# Patient Record
Sex: Male | Born: 1935 | Race: White | Hispanic: No | Marital: Single | Smoking: Former smoker
Health system: Southern US, Community
[De-identification: ages and names within clinical notes are randomized; demographics above are authoritative.]

## PROBLEM LIST (undated history)

## (undated) DIAGNOSIS — Z7901 Long term (current) use of anticoagulants: Secondary | ICD-10-CM

## (undated) DIAGNOSIS — E785 Hyperlipidemia, unspecified: Secondary | ICD-10-CM

## (undated) DIAGNOSIS — E611 Iron deficiency: Secondary | ICD-10-CM

## (undated) DIAGNOSIS — I480 Paroxysmal atrial fibrillation: Secondary | ICD-10-CM

## (undated) DIAGNOSIS — D649 Anemia, unspecified: Secondary | ICD-10-CM

## (undated) DIAGNOSIS — M1990 Unspecified osteoarthritis, unspecified site: Secondary | ICD-10-CM

## (undated) DIAGNOSIS — N189 Chronic kidney disease, unspecified: Secondary | ICD-10-CM

## (undated) DIAGNOSIS — K635 Polyp of colon: Secondary | ICD-10-CM

## (undated) DIAGNOSIS — I119 Hypertensive heart disease without heart failure: Secondary | ICD-10-CM

## (undated) DIAGNOSIS — C439 Malignant melanoma of skin, unspecified: Secondary | ICD-10-CM

## (undated) DIAGNOSIS — I2510 Atherosclerotic heart disease of native coronary artery without angina pectoris: Secondary | ICD-10-CM

## (undated) DIAGNOSIS — R7303 Prediabetes: Secondary | ICD-10-CM

## (undated) HISTORY — DX: Hypertensive heart disease without heart failure: I11.9

## (undated) HISTORY — DX: Malignant melanoma of skin, unspecified: C43.9

## (undated) HISTORY — DX: Hyperlipidemia, unspecified: E78.5

## (undated) HISTORY — DX: Iron deficiency: E61.1

## (undated) HISTORY — DX: Prediabetes: R73.03

## (undated) HISTORY — DX: Atherosclerotic heart disease of native coronary artery without angina pectoris: I25.10

## (undated) HISTORY — DX: Polyp of colon: K63.5

## (undated) HISTORY — PX: DENTAL SURGERY: SHX609

## (undated) HISTORY — DX: Anemia, unspecified: D64.9

## (undated) HISTORY — DX: Long term (current) use of anticoagulants: Z79.01

## (undated) HISTORY — DX: Paroxysmal atrial fibrillation: I48.0

## (undated) HISTORY — PX: OTHER SURGICAL HISTORY: SHX169

## (undated) HISTORY — PX: MELANOMA EXCISION: SHX5266

## (undated) HISTORY — DX: Chronic kidney disease, unspecified: N18.9

## (undated) HISTORY — DX: Unspecified osteoarthritis, unspecified site: M19.90

---

## 2004-09-30 ENCOUNTER — Ambulatory Visit: Admission: RE | Admit: 2004-09-30 | Discharge: 2004-09-30 | Payer: Self-pay | Admitting: Dermatology

## 2008-03-24 ENCOUNTER — Emergency Department (HOSPITAL_COMMUNITY): Admission: EM | Admit: 2008-03-24 | Discharge: 2008-03-24 | Payer: Self-pay | Admitting: Emergency Medicine

## 2008-03-28 ENCOUNTER — Inpatient Hospital Stay (HOSPITAL_COMMUNITY): Admission: EM | Admit: 2008-03-28 | Discharge: 2008-04-01 | Payer: Self-pay | Admitting: Emergency Medicine

## 2010-06-08 LAB — POCT I-STAT, CHEM 8
Calcium, Ion: 1.08 mmol/L — ABNORMAL LOW (ref 1.12–1.32)
Chloride: 107 mEq/L (ref 96–112)
Creatinine, Ser: 1.9 mg/dL — ABNORMAL HIGH (ref 0.4–1.5)
Glucose, Bld: 142 mg/dL — ABNORMAL HIGH (ref 70–99)
HCT: 41 % (ref 39.0–52.0)

## 2010-06-08 LAB — POCT CARDIAC MARKERS
CKMB, poc: 2.6 ng/mL (ref 1.0–8.0)
Myoglobin, poc: 198 ng/mL (ref 12–200)

## 2010-06-08 LAB — BASIC METABOLIC PANEL
BUN: 26 mg/dL — ABNORMAL HIGH (ref 6–23)
BUN: 30 mg/dL — ABNORMAL HIGH (ref 6–23)
CO2: 30 mEq/L (ref 19–32)
Calcium: 8.8 mg/dL (ref 8.4–10.5)
Chloride: 102 mEq/L (ref 96–112)
Creatinine, Ser: 1.4 mg/dL (ref 0.4–1.5)
Creatinine, Ser: 1.46 mg/dL (ref 0.4–1.5)
GFR calc Af Amer: 59 mL/min — ABNORMAL LOW (ref >60)
GFR calc non Af Amer: 46 mL/min — ABNORMAL LOW (ref >60)
Glucose, Bld: 132 mg/dL — ABNORMAL HIGH (ref 70–99)
Glucose, Bld: 136 mg/dL — ABNORMAL HIGH (ref 70–99)
Potassium: 3.6 mEq/L (ref 3.5–5.1)

## 2010-06-08 LAB — URINALYSIS, ROUTINE W REFLEX MICROSCOPIC
Bilirubin Urine: NEGATIVE
Hgb urine dipstick: NEGATIVE
Ketones, ur: NEGATIVE mg/dL
Nitrite: NEGATIVE
Specific Gravity, Urine: 1.023 (ref 1.005–1.030)
pH: 5 (ref 5.0–8.0)

## 2010-06-08 LAB — PROTIME-INR
INR: 2.2 — ABNORMAL HIGH (ref 0.00–1.49)
INR: 2.2 — ABNORMAL HIGH (ref 0.00–1.49)
INR: 3 — ABNORMAL HIGH (ref 0.00–1.49)
Prothrombin Time: 23.6 seconds — ABNORMAL HIGH (ref 11.6–15.2)
Prothrombin Time: 26 seconds — ABNORMAL HIGH (ref 11.6–15.2)
Prothrombin Time: 33.8 seconds — ABNORMAL HIGH (ref 11.6–15.2)

## 2010-06-08 LAB — CBC
MCHC: 34.6 g/dL (ref 30.0–36.0)
MCV: 95.7 fL (ref 78.0–100.0)
RBC: 3.83 MIL/uL — ABNORMAL LOW (ref 4.22–5.81)
RDW: 13.7 % (ref 11.5–15.5)

## 2010-06-08 LAB — URINE CULTURE
Colony Count: NO GROWTH
Culture: NO GROWTH

## 2010-06-08 LAB — URINE MICROSCOPIC-ADD ON

## 2010-06-08 LAB — BRAIN NATRIURETIC PEPTIDE: Pro B Natriuretic peptide (BNP): 799 pg/mL — ABNORMAL HIGH (ref 0.0–100.0)

## 2010-07-06 NOTE — H&P (Signed)
NAME:  HEIDI, VICENCIO NO.:  [id number]   MEDICAL RECORD NO.:  [id number]          PATIENT TYPE:  EMS   LOCATION:  ED                           FACILITY:  [facility]   PHYSICIAN:  Corinna L. Conley Canal, MDDATE OF BIRTH:  04-Apr-1923   DATE OF ADMISSION:  03/28/2008  DATE OF DISCHARGE:                              HISTORY & PHYSICAL   CHIEF COMPLAINT:  Shortness of breath.   HISTORY OF PRESENT ILLNESS:  Mr. Behnen is an 77 year old white male  who presents with shortness of breath.  He began having a nonproductive  cough 2 days ago.  He has had several sick contacts including his wife  and son with respiratory illness.  He initially was having low grade  fevers of about 99 degrees but last night spiked fevers to 102.5.  He  also became very short of breath with exertion.  EMS was called but the  patient refused transportation to the emergency room.  Instead, he  presents this morning.  According to the ED physician, he was in  respiratory distress, had poor air movement and was sitting bolt  upright.  Since then, he has received Solu-Medrol and bronchodilators  and feels much better.  He has rhinorrhea.  He has no myalgias.  He has  no sore throat, headache.  His son was told in Urgent Care, yesterday,  that he might have the flu.  The patient has a remote history of  smoking, but does not carry the diagnosis of COPD.  He has never been on  bronchodilators.  He reports that the nebulizer today helped quite a  bit.   PAST MEDICAL HISTORY:  1. Hypertension.  2. Atrial fibrillation.  3. Osteoarthritis.  4. Resected melanoma of the scalp.  5. Hypertension.   MEDICATIONS:  1. Z-Pak was called in yesterday, and he is taking a dose.  2. Hydrochlorothiazide 12.5 mg a day.  3. Toprol XL 100 mg a day.  4. Coumadin 2.5 mg every Monday, Wednesday, Friday; 5 mg every other      day.  5. Vicodin as needed for left knee pain.  6. Delsym as needed for cough.   SOCIAL HISTORY:   The patient is married.  He lives with his debilitated  wife and son.  He used to smoke a pack to two packs of cigarettes a day  but quit 20 years ago.  He has no history of heavy alcohol use.   FAMILY HISTORY:  His brother died of unknown cancer.  His sister died of  unknown cancer.   REVIEW OF SYSTEMS:  As above, otherwise negative.   PHYSICAL EXAMINATION:  VITAL SIGNS:  His temperature here is 98 degrees,  blood pressure 129/75, pulse 75, respiratory rate 24, oxygen saturation  97% on oxygen.  GENERAL:  The patient is comfortable.  He is lying about 40 degrees  angle, unable to speak in full sentences.  HEENT:  Normocephalic, atraumatic.  Pupils equal, round, reactive to  light.  Slightly dry mucous membranes.  NECK:  Supple.  No lymphadenopathy.  LUNGS:  Without rales, rhonchi or wheeze.  He  has good air movement,  slightly prolonged expiratory phase.  CARDIOVASCULAR:  Sounds fairly  regular with a few premature beats.  No murmurs, gallops or rubs.  ABDOMEN:  Soft, nontender, nondistended.  GU/RECTAL:  Deferred.  EXTREMITIES:  No clubbing, cyanosis or edema.  The left knee has  decreased range of motion due to pain.  There is no effusion or warmth.  No instability of the joint.  NEUROLOGIC:  The patient is alert and oriented.  Cranial nerves and  sensorimotor exam are intact.  PSYCHIATRIC:  Normal affect.   LABORATORY DATA:  CBC was not done but hemoglobin and hematocrit are  normal.  INR is 3.0.  Basic metabolic panel significant for a glucose of  142, BUN of 24, creatinine 1.9.  Point of care enzymes negative.  BNP  799.  EKG shows probable normal sinus rhythm with PVCs, left axis  deviation.  He is not currently on the monitor.  Chest x-ray shows  elevated left hemidiaphragm.  No infiltrate or CHF.   ASSESSMENT/PLAN:  1. Flu-like illness.  Consider influenza versus acute bronchitis with      bronchospasm, currently improved.  The patient will receive      Tamiflu,  continue Z-Pak.  He will get bronchodilators.  No steroids      are needed at this time as his breathing is improved.  I will order      a seasonal flu swab.  2. Renal insufficiency.  Hold hydrochlorothiazide.  3. Hypertension.  Continue metoprolol.  4. Atrial fibrillation.  He will be monitored on telemetry.  Ask the      pharmacy to dose Coumadin.  He is rate controlled.  5. History of iron deficiency anemia per outpatient records.  6. Possible history of coronary artery disease per outpatient records,      but the patient denies and I have no details.  7. Osteoarthritis.  The patient has an appointment with Dr. Onnie Graham      next week.      Corinna L. Conley Canal, MD  Electronically Signed     CLS/MEDQ  D:  03/28/2008  T:  03/28/2008  Job:  [id number]   cc:   Wenda Low, MD  Fax: [phone]

## 2010-07-06 NOTE — Discharge Summary (Signed)
NAMELEANDRA, CHISUM NO.:  [id number]   MEDICAL RECORD NO.:  [id number]          PATIENT TYPE:  INP   LOCATION:  [id number]                         FACILITY:  [facility]   PHYSICIAN:  Wenda Low, MD      DATE OF BIRTH:  01-Nov-1923   DATE OF ADMISSION:  03/28/2008  DATE OF DISCHARGE:  04/01/2008                               DISCHARGE SUMMARY   DISCHARGE DIAGNOSES:  1. Upper respiratory infection, probably bronchitis versus with some      shortness of breath and wheezing.  2. Atrial fibrillation, rapid ventricular response.  3. History of hypertension.  4. History of chronic anticoagulation.  5. History of melanoma of the scalp.  6. History of osteoarthritis.   DISCHARGE MEDICATIONS:  1. Toprol XL 50 mg 2 tablets in the evening.  2. Coumadin 2.5 mg every Monday, Wednesday, Friday and 5 mg other      days.  3. Delsym cough syrup p.r.n.  4. Cardizem 120 mg q.a.m.  5. Xopenex 0.63 mg nebulizer 2-3 times a day as needed.  6. Tessalon cough perles 200 mg t.i.d. p.r.n. for cough.   DISCONTINUED MEDICATION:  Hydrochlorothiazide.   LABORATORY DATA:  White count 10.9, hemoglobin 12.7, creatinine 1.4, BUN  of 30, potassium 3.6.  Cardiac markers negative.  BNP initially was 799.  UA negative.   Vitals at discharge:  Blood pressure 117/64, heart rate 80 normal sinus  rhythm, temperature 98.1, respirations 18.   DIAGNOSTICS:  Chest x-ray negative.   HOSPITAL COURSE:  An 78 year old male admitted with shortness of breath  and coughing.  Was on azithromycin started about the day before prior to  admission.  Presented with possibly fever and congestion.  Chest x-ray  was negative.  Initially started on Tamiflu and also azithromycin.  He  finished a 5 day course of the Tamiflu and azithromycin, afebrile.  White count remained normal.  He had some wheezing and congestion, and  required some nebulizer treatments with improvement.  It is thought to  be either upper respiratory  illness versus flu.  He did finish a course  of the Tamiflu as well as azithromycin.  Cough improved and oxygenation  remained stable.  1. Cardiac history of atrial fibrillation and hypertension on Coumadin      and Toprol.  Went into AFib with rapid ventricular response in the      130-140s during the hospital course and required some IV Cardizem      overnight which he converted back to normal sinus rhythm.  He was      continued on beta blockers, Toprol.  His Cardizem      was switched from IV to p.o. He will continue that along with the      beta-blocker.  Blood pressure remained stable.  His      hydrochlorothiazide was discontinued.   FOLLOW UP:  Followup in 10 days with me at the office.      Wenda Low, MD  Electronically Signed     KH/MEDQ  D:  04/01/2008  T:  04/01/2008  Job:  [id number]

## 2010-07-09 NOTE — Op Note (Signed)
NAME:  James Strong, James Strong NO.:  [id number]   MEDICAL RECORD NO.:  [id number]          PATIENT TYPE:  AMB   LOCATION:  CATH                         FACILITY:  [facility]   PHYSICIAN:  Fransico Him, M.D.     DATE OF BIRTH:  13-Feb-1924   DATE OF PROCEDURE:  09/30/2004  DATE OF DISCHARGE:                                 OPERATIVE REPORT   REFERRING PHYSICIAN:  Dr. Lysle Rubens.   PROCEDURE:  Left heart catheterization, coronary angiography, left  ventriculography.   OPERATOR:  Fransico Him, M.D.   INDICATIONS:  Abnormal Cardiolite, atrial fibrillation, shortness of breath.   COMPLICATIONS:  None.   INTRAVENOUS ACCESS:  Via right femoral artery 6-French sheath.   HISTORY:  This is a very pleasant, 77 year old male with a history of  hypertension, anemia, and melanoma who has new onset atrial fibrillation and  dyspnea on exertion. He was found to have an EF of 50 to 55% by 2-D  echocardiogram. A stress Cardiolite study revealed an inferior wall defect,  question of diaphragmatic attenuation, increased gut uptake versus  underlying coronary disease. EF by Cardiolite was 42%. He now presents for  cardiac catheterization.   The patient was brought to the cardiac catheterization laboratory in the  fasting nonsedated stated. Informed consent was obtained. The patient was  connected to continuous heart rate and pulse oximetry monitoring and  intermittent blood pressure monitoring. The right groin was prepped and  draped in a sterile fashion. One percent Xylocaine was used for local  anesthesia. Using a modified Seldinger technique, a 6-French sheath was  placed in the right femoral artery. Under fluoroscopic guidance, a 6-French  JL4 catheter was placed in the left coronary artery. Multiple cine films  were taken in 30-degree RAO and 40-degree LAO views. This catheter was then  exchanged out over a guide wire for a 6-French JR4 catheter which was placed  under fluoroscopic  guidance in the right coronary artery. Multiple cine  films were taken in 30-degree RAO and 40-degree LAO views. This catheter was  then exchanged out over a guide wire for a 6-French angled pigtailed  catheter which was placed under fluoroscopic guidance in the left  ventricular cavity. Left ventriculography was performed in 30-degree RAO  view using a total of 30 cc of contrast at 15 cc per second. The catheter  was then pulled back across the valve with no significant gradient noted. At  the end of the procedure, all catheters and sheaths were removed. Manual  compression was performed until adequate hemostasis was obtained. The  patient was transferred back to the room in stable condition.   RESULTS:  The left main coronary artery was widely patent and bifurcates in  the left anterior descending artery and left circumflex artery, actually it  trifurcates in the left anterior descending artery, ramus artery, and left  circumflex artery. Left anterior descending artery gives rise to three  diagonal branches. The first diagonal branch is widely patent. Between the  first and second diagonal branches, there is a 30 to 40% narrowing of the  mid LAD. Then, the LAD gives  rise to two diagonal branches, both of which  are widely patent. The ongoing LAD is widely patent throughout its course.   The ramus is widely patent.   The left circumflex is widely patent throughout its course and gives rise to  one obtuse marginal branch which is widely patent.   The right coronary artery shows luminal irregularities in its proximal and  mid portions and the bifurcates distally in the posterior descending artery  and posterior lateral artery. The posterior descending artery is widely  patent. The posterior lateral artery has a 30% mid stenosis.   Left ventriculography shows normal LV systolic function, EF 123456. Aortic  pressure 150/66 mmHg, LV pressure 153/5 mmHg, LVEDP 15 mmHg.   ASSESSMENT:  1.   Nonobstructive coronary disease.  2.  Normal left ventricular function.  3.  Hypertension.  4.  Atrial fibrillation.  5.  Shortness of breath.   PLAN:  Discharge to home after IV fluids and bed rest. Follow up with me in  two weeks for groin check. Restart Coumadin. Follow up with me in three to  four weeks for reassessment of atrial fibrillation. Once INR is therapeutic  for four weeks, the patient will need cardioversion.       TT/MEDQ  D:  09/30/2004  T:  09/30/2004  Job:  [id number]   cc:   Wenda Low, MD  [address] [place]  [place] [address]  Fax: [phone]

## 2011-12-16 ENCOUNTER — Encounter: Payer: Self-pay | Admitting: Cardiology

## 2012-12-11 ENCOUNTER — Encounter: Payer: Self-pay | Admitting: Cardiology

## 2012-12-11 ENCOUNTER — Encounter: Payer: Self-pay | Admitting: *Deleted

## 2012-12-11 DIAGNOSIS — M1990 Unspecified osteoarthritis, unspecified site: Secondary | ICD-10-CM | POA: Insufficient documentation

## 2012-12-11 DIAGNOSIS — N189 Chronic kidney disease, unspecified: Secondary | ICD-10-CM | POA: Insufficient documentation

## 2012-12-11 DIAGNOSIS — E611 Iron deficiency: Secondary | ICD-10-CM | POA: Insufficient documentation

## 2012-12-11 DIAGNOSIS — C439 Malignant melanoma of skin, unspecified: Secondary | ICD-10-CM | POA: Insufficient documentation

## 2012-12-11 DIAGNOSIS — I2510 Atherosclerotic heart disease of native coronary artery without angina pectoris: Secondary | ICD-10-CM | POA: Insufficient documentation

## 2012-12-11 DIAGNOSIS — E785 Hyperlipidemia, unspecified: Secondary | ICD-10-CM | POA: Insufficient documentation

## 2012-12-11 DIAGNOSIS — R7303 Prediabetes: Secondary | ICD-10-CM | POA: Insufficient documentation

## 2012-12-11 DIAGNOSIS — I10 Essential (primary) hypertension: Secondary | ICD-10-CM | POA: Insufficient documentation

## 2012-12-11 DIAGNOSIS — D638 Anemia in other chronic diseases classified elsewhere: Secondary | ICD-10-CM | POA: Insufficient documentation

## 2012-12-11 DIAGNOSIS — K635 Polyp of colon: Secondary | ICD-10-CM | POA: Insufficient documentation

## 2012-12-11 DIAGNOSIS — D649 Anemia, unspecified: Secondary | ICD-10-CM | POA: Insufficient documentation

## 2012-12-12 ENCOUNTER — Encounter: Payer: Self-pay | Admitting: Cardiology

## 2012-12-12 DIAGNOSIS — Z7901 Long term (current) use of anticoagulants: Secondary | ICD-10-CM | POA: Insufficient documentation

## 2012-12-13 ENCOUNTER — Ambulatory Visit (INDEPENDENT_AMBULATORY_CARE_PROVIDER_SITE_OTHER): Payer: Medicare Other | Admitting: Cardiology

## 2012-12-13 ENCOUNTER — Ambulatory Visit (INDEPENDENT_AMBULATORY_CARE_PROVIDER_SITE_OTHER): Payer: Medicare Other | Admitting: Pharmacist

## 2012-12-13 ENCOUNTER — Encounter: Payer: Self-pay | Admitting: Cardiology

## 2012-12-13 VITALS — BP 160/84 | HR 55 | Ht 68.0 in | Wt 162.0 lb

## 2012-12-13 DIAGNOSIS — I4891 Unspecified atrial fibrillation: Secondary | ICD-10-CM

## 2012-12-13 DIAGNOSIS — Z7901 Long term (current) use of anticoagulants: Secondary | ICD-10-CM

## 2012-12-13 DIAGNOSIS — Z0000 Encounter for general adult medical examination without abnormal findings: Secondary | ICD-10-CM

## 2012-12-13 DIAGNOSIS — I4819 Other persistent atrial fibrillation: Secondary | ICD-10-CM | POA: Insufficient documentation

## 2012-12-13 DIAGNOSIS — I119 Hypertensive heart disease without heart failure: Secondary | ICD-10-CM

## 2012-12-13 DIAGNOSIS — I452 Bifascicular block: Secondary | ICD-10-CM | POA: Insufficient documentation

## 2012-12-13 DIAGNOSIS — R9431 Abnormal electrocardiogram [ECG] [EKG]: Secondary | ICD-10-CM

## 2012-12-13 DIAGNOSIS — I480 Paroxysmal atrial fibrillation: Secondary | ICD-10-CM

## 2012-12-13 DIAGNOSIS — E785 Hyperlipidemia, unspecified: Secondary | ICD-10-CM

## 2012-12-13 DIAGNOSIS — I2510 Atherosclerotic heart disease of native coronary artery without angina pectoris: Secondary | ICD-10-CM

## 2012-12-13 LAB — POCT INR: INR: 3.6

## 2012-12-13 NOTE — Progress Notes (Signed)
[address] [place], [place]  [address] Phone: [phone] Fax:  [phone]  Date:  12/13/2012   ID:  James Strong, DOB December 08, 1923, MRN [id number]  PCP:  No primary provider on file.  Cardiologist:  Fransico Him, MD     History of Present Illness: James Strong is a 79 y.o. male with a history of nonobstructive ASCAD, dyslipidemia, PAF, HTN and chronic anticoagulation who presents today for followup.  He is doing well.  He denies any chest pain, SOB, DOE, LE edema, dizziness, palpitations or syncope.  He fishes for exercise.  He is limited in his exercise due to bad knees.   Wt Readings from Last 3 Encounters:  12/13/12 162 lb (73.483 kg)     Past Medical History  Diagnosis Date  . Prediabetes   . Hyperlipidemia   . Benign hypertensive heart disease without heart failure   . Melanoma of skin, site unspecified   . Anemia   . Iron deficiency   . Osteoarthrosis   . CKD (chronic kidney disease)   . Colon polyp   . PAF (paroxysmal atrial fibrillation)   . Chronic anticoagulation   . HTN (hypertension)   . Coronary atherosclerosis of native coronary artery     nonobstructive ASCAD - 30-40% mid LAD and 30% mid PL by cath 2006    Current Outpatient Prescriptions  Medication Sig Dispense Refill  . diltiazem (TIAZAC) 120 MG 24 hr capsule Take 120 mg by mouth daily.      . metoprolol (LOPRESSOR) 50 MG tablet Take 50 mg by mouth 2 (two) times daily.      . simvastatin (ZOCOR) 20 MG tablet Take 1/2 tab daily      . warfarin (COUMADIN) 5 MG tablet Take 5 mg by mouth daily. As directed       No current facility-administered medications for this visit.    Allergies:   No Known Allergies  Social History:  The patient  reports that he quit smoking about 43 years ago. He does not have any smokeless tobacco history on file. He reports that he drinks alcohol.   Family History:  The patient's family history includes Dementia in his father.   ROS:  Please see the  history of present illness.      All other systems reviewed and negative.   PHYSICAL EXAM: VS:  BP 160/84  Pulse 55  Ht 5\' 8"  (1.727 m)  Wt 162 lb (73.483 kg)  BMI 24.64 kg/m2 Well nourished, well developed, in no acute distress HEENT: normal Neck: no JVD Cardiac:  normal S1, S2; RRR; no murmur Lungs:  clear to auscultation bilaterally, no wheezing, rhonchi or rales Abd: soft, nontender, no hepatomegaly Ext: no edema Skin: warm and dry Neuro:  CNs 2-12 intact, no focal abnormalities noted  EKG:  NSR with RBBB and LAFB     ASSESSMENT AND PLAN:  1. PAF maintaining NSR  - continue Diltiazem/metoprolol/warfarin 2.   RBBB/LAFB - this is new for him.  I will get a Lexiscan myoview to rule out ischemia 3.   Dyslipidemia  - continue simvastatin 4.   HTN with mildly elevated BP.    - continue metoprolol/Diltiazem  - counseled in low sodium diet and cut out salt when cooking  - BP check with nurse in our office in 2 weeks 5.   Nonobstructive ASCAD 6.   Chronic anticoagulation  Followup with me in 6 months  Signed, Fransico Him, MD 12/13/2012 11:21 AM

## 2012-12-13 NOTE — Patient Instructions (Signed)
Your physician recommends that you continue on your current medications as directed. Please refer to the Current Medication list given to you today.  Your physician wants you to follow-up in: 6 months with Dr Mallie Snooks will receive a reminder letter in the mail two months in advance. If you don't receive a letter, please call our office to schedule the follow-up appointment.  Set up a Nurse visit in 1-2 weeks for a BP check  Your physician has requested that you have a lexiscan myoview. For further information please visit [email or web address]. Please follow instruction sheet, as given.

## 2012-12-31 ENCOUNTER — Ambulatory Visit (INDEPENDENT_AMBULATORY_CARE_PROVIDER_SITE_OTHER): Payer: Medicare Other | Admitting: *Deleted

## 2012-12-31 ENCOUNTER — Ambulatory Visit (INDEPENDENT_AMBULATORY_CARE_PROVIDER_SITE_OTHER): Payer: Medicare Other | Admitting: Pharmacist

## 2012-12-31 ENCOUNTER — Ambulatory Visit (HOSPITAL_COMMUNITY): Payer: Medicare Other | Attending: Cardiology | Admitting: Radiology

## 2012-12-31 VITALS — BP 120/62 | HR 62 | Wt 156.8 lb

## 2012-12-31 VITALS — BP 150/70 | HR 58 | Ht 69.0 in | Wt 157.0 lb

## 2012-12-31 DIAGNOSIS — E785 Hyperlipidemia, unspecified: Secondary | ICD-10-CM | POA: Insufficient documentation

## 2012-12-31 DIAGNOSIS — I4510 Unspecified right bundle-branch block: Secondary | ICD-10-CM | POA: Insufficient documentation

## 2012-12-31 DIAGNOSIS — R9431 Abnormal electrocardiogram [ECG] [EKG]: Secondary | ICD-10-CM | POA: Insufficient documentation

## 2012-12-31 DIAGNOSIS — I10 Essential (primary) hypertension: Secondary | ICD-10-CM | POA: Insufficient documentation

## 2012-12-31 DIAGNOSIS — I4891 Unspecified atrial fibrillation: Secondary | ICD-10-CM

## 2012-12-31 DIAGNOSIS — Z87891 Personal history of nicotine dependence: Secondary | ICD-10-CM | POA: Insufficient documentation

## 2012-12-31 DIAGNOSIS — I2510 Atherosclerotic heart disease of native coronary artery without angina pectoris: Secondary | ICD-10-CM

## 2012-12-31 MED ORDER — REGADENOSON 0.4 MG/5ML IV SOLN
0.4000 mg | Freq: Once | INTRAVENOUS | Status: AC
  Administered 2012-12-31: 0.4 mg via INTRAVENOUS

## 2012-12-31 MED ORDER — TECHNETIUM TC 99M SESTAMIBI GENERIC - CARDIOLITE
10.0000 | Freq: Once | INTRAVENOUS | Status: AC | PRN
  Administered 2012-12-31: 10 via INTRAVENOUS

## 2012-12-31 MED ORDER — TECHNETIUM TC 99M SESTAMIBI GENERIC - CARDIOLITE
30.0000 | Freq: Once | INTRAVENOUS | Status: AC | PRN
  Administered 2012-12-31: 30 via INTRAVENOUS

## 2012-12-31 NOTE — Progress Notes (Signed)
-

## 2012-12-31 NOTE — Progress Notes (Signed)
Patient here for BP check today. Follow up BP from 10/23 office visit with Dr. Radford Pax. Patient is without complaints. He has been on a salt free diet since his last office visit. He has taken his medications this morning. He is pending a myoview today. BP well controlled. Will forward to Dr. Radford Pax for review.

## 2012-12-31 NOTE — Progress Notes (Signed)
  [facility] [address] [place],  [address] [phone]    Cardiology Nuclear Med Study  [name] is a 79 y.o. male     MRN : [id number]     DOB: 29-Jun-1923  Procedure Date: 12/31/2012  Nuclear Med Background Indication for Stress Test:  Evaluation for Ischemia and Abnormal EKG History:  n/o CAD; h/o PAF; Echo; '06 [id number] wall ischemia, EF=42% Cardiac Risk Factors: History of Smoking, Hypertension, Lipids and RBBB  Symptoms:  No cardiac complaints today.   Nuclear Pre-Procedure Caffeine/Decaff Intake:  7:00pm NPO After: 7:00pm   Lungs:  Clear. O2 Sat: 96% on room air. IV 0.9% NS with Angio Cath:  22g  IV Site: R Hand  IV Started by:  Matilde Haymaker, RN  Chest Size (in):  42 Cup Size: n/a  Height: 5\' 9"  (1.753 m)  Weight:  157 lb (71.215 kg)  BMI:  Body mass index is 23.17 kg/(m^2). Tech Comments: Lopressor taken at [facility] Study 1 or 2 day study: 1 day  Stress Test Type:  Lexiscan  Reading MD: Fransico Him, MD  Order Authorizing Provider:  Vonna Drafts  Resting Radionuclide: Technetium 58m Sestamibi  Resting Radionuclide Dose: 11.0 mCi   Stress Radionuclide:  Technetium 73m Sestamibi  Stress Radionuclide Dose: 33.0 mCi           Stress Protocol Rest HR: 58 Stress HR: 75  Rest BP: 150/70 Stress BP: 151/73  Exercise Time (min): n/a METS: n/a   Predicted Max HR: 133 bpm % Max HR: 56.39 bpm Rate Pressure Product: 11325   Dose of Adenosine (mg):  n/a Dose of Lexiscan: 0.4 mg  Dose of Atropine (mg): n/a Dose of Dobutamine: n/a mcg/kg/min (at max HR)  Stress Test Technologist: Letta Moynahan, CMA-N  Nuclear Technologist:  Annye Rusk, CNMT     Rest Procedure:  Myocardial perfusion imaging was performed at rest 45 minutes following the intravenous administration of Technetium 73m Sestamibi.  Rest ECG: NSR with IRBBB  Stress Procedure:  The patient received IV Lexiscan 0.4 mg over 15-seconds.   Technetium 68m Sestamibi injected at 30-seconds.  Occasional PVC's were noted with Lexiscan.  Quantitative spect images were obtained after a 45 minute delay.  Stress ECG: No significant change from baseline ECG  QPS Raw Data Images:  Mild diaphragmatic attenuation.  Normal left ventricular size. Stress Images:  There is decreased uptake in the inferior wall. Rest Images:  There is decreased uptake in the inferior wall. Subtraction (SDS):  There is a fixed inferior defect that is most consistent with diaphragmatic attenuation. Transient Ischemic Dilatation (Normal <1.22):  0.95 Lung/Heart Ratio (Normal <0.45):  0.41  Quantitative Gated Spect Images QGS EDV:  100 ml QGS ESV:  51 ml  Impression Exercise Capacity:  Lexiscan with no exercise. BP Response:  Normal blood pressure response. Clinical Symptoms:  No significant symptoms noted. ECG Impression:  No significant ST segment change suggestive of ischemia. Comparison with Prior Nuclear Study: No images to compare  Overall Impression:  Low risk stress nuclear study fixed defect in the inferoapical segments consistent with diaphragmatic attenuation and gut uptake.  LV Ejection Fraction: 49%.  LV Wall Motion:  NL LV Function; NL Wall Motion   Signed: Fransico Him, MD 12/31/2012

## 2013-01-04 ENCOUNTER — Telehealth: Payer: Self-pay | Admitting: Cardiology

## 2013-01-04 NOTE — Telephone Encounter (Signed)
Had already made Pt aware that stress test results came back normal. I told [name] she could call me back to discuss if she wanted to.

## 2013-01-04 NOTE — Telephone Encounter (Signed)
Follow Up  Daughter calling back for stress results

## 2013-01-28 ENCOUNTER — Ambulatory Visit (INDEPENDENT_AMBULATORY_CARE_PROVIDER_SITE_OTHER): Payer: Medicare Other | Admitting: Pharmacist

## 2013-01-28 DIAGNOSIS — I4891 Unspecified atrial fibrillation: Secondary | ICD-10-CM

## 2013-01-28 LAB — POCT INR: INR: 2.1

## 2013-03-11 ENCOUNTER — Ambulatory Visit (INDEPENDENT_AMBULATORY_CARE_PROVIDER_SITE_OTHER): Payer: Medicare Other | Admitting: Pharmacist

## 2013-03-11 DIAGNOSIS — I4891 Unspecified atrial fibrillation: Secondary | ICD-10-CM

## 2013-03-11 LAB — POCT INR: INR: 2.6

## 2013-04-22 ENCOUNTER — Ambulatory Visit (INDEPENDENT_AMBULATORY_CARE_PROVIDER_SITE_OTHER): Payer: Medicare Other | Admitting: Pharmacist

## 2013-04-22 DIAGNOSIS — I4891 Unspecified atrial fibrillation: Secondary | ICD-10-CM

## 2013-04-22 LAB — POCT INR: INR: 2.6

## 2013-06-03 ENCOUNTER — Ambulatory Visit (INDEPENDENT_AMBULATORY_CARE_PROVIDER_SITE_OTHER): Payer: Medicare Other | Admitting: Pharmacist

## 2013-06-03 DIAGNOSIS — I4891 Unspecified atrial fibrillation: Secondary | ICD-10-CM

## 2013-06-03 LAB — POCT INR: INR: 2

## 2013-06-17 NOTE — Telephone Encounter (Signed)
Please close encounter, Thanks! SR

## 2013-07-18 ENCOUNTER — Ambulatory Visit (INDEPENDENT_AMBULATORY_CARE_PROVIDER_SITE_OTHER): Payer: Medicare Other

## 2013-07-18 DIAGNOSIS — I4891 Unspecified atrial fibrillation: Secondary | ICD-10-CM

## 2013-07-18 LAB — POCT INR: INR: 1.9

## 2013-08-29 ENCOUNTER — Ambulatory Visit (INDEPENDENT_AMBULATORY_CARE_PROVIDER_SITE_OTHER): Payer: Medicare Other

## 2013-08-29 DIAGNOSIS — I4891 Unspecified atrial fibrillation: Secondary | ICD-10-CM

## 2013-08-29 LAB — POCT INR: INR: 1.8

## 2013-09-26 ENCOUNTER — Ambulatory Visit (INDEPENDENT_AMBULATORY_CARE_PROVIDER_SITE_OTHER): Payer: Medicare Other | Admitting: *Deleted

## 2013-09-26 DIAGNOSIS — I4891 Unspecified atrial fibrillation: Secondary | ICD-10-CM

## 2013-09-26 LAB — POCT INR: INR: 2

## 2013-10-24 ENCOUNTER — Ambulatory Visit (INDEPENDENT_AMBULATORY_CARE_PROVIDER_SITE_OTHER): Payer: Medicare Other

## 2013-10-24 DIAGNOSIS — I4891 Unspecified atrial fibrillation: Secondary | ICD-10-CM

## 2013-10-24 LAB — POCT INR: INR: 2.2

## 2013-11-25 NOTE — Progress Notes (Signed)
  [address] [place], [place]  [address] Phone: [phone] Fax:  [phone]  Date:  11/26/2013   ID:  James Strong, DOB 1923-07-10, MRN [id number]  PCP:  Wenda Low, MD  Cardiologist:  Fransico Him, MD    History of Present Illness: James Strong is a 80 y.o. male with a history of nonobstructive ASCAD, dyslipidemia, PAF, HTN and chronic anticoagulation who presents today for followup. He is doing well. He denies any chest pain, SOB, DOE, LE edema, dizziness, palpitations or syncope. He fishes for exercise. He is limited in his exercise due to bad knees.     Wt Readings from Last 3 Encounters:  11/26/13 154 lb (69.854 kg)  12/31/12 157 lb (71.215 kg)  12/31/12 156 lb 12 oz (71.101 kg)     Past Medical History  Diagnosis Date  . Prediabetes   . Hyperlipidemia   . Benign hypertensive heart disease without heart failure   . Melanoma of skin, site unspecified   . Anemia   . Iron deficiency   . Osteoarthrosis   . CKD (chronic kidney disease)   . Colon polyp   . PAF (paroxysmal atrial fibrillation)   . Chronic anticoagulation   . HTN (hypertension)   . Coronary atherosclerosis of native coronary artery     nonobstructive ASCAD - 30-40% mid LAD and 30% mid PL by cath 2006    Current Outpatient Prescriptions  Medication Sig Dispense Refill  . diltiazem (TIAZAC) 120 MG 24 hr capsule Take 120 mg by mouth daily.      . metoprolol (LOPRESSOR) 50 MG tablet Take 50 mg by mouth 2 (two) times daily.      . simvastatin (ZOCOR) 20 MG tablet Take 1/2 tab daily      . warfarin (COUMADIN) 5 MG tablet Take 5 mg by mouth daily. As directed       No current facility-administered medications for this visit.    Allergies:   No Known Allergies  Social History:  The patient  reports that he quit smoking about 43 years ago. He does not have any smokeless tobacco history on file. He reports that he drinks alcohol.   Family History:  The patient's family history  includes Dementia in his father.   ROS:  Please see the history of present illness.      All other systems reviewed and negative.   PHYSICAL EXAM: VS:  BP 140/62  Pulse 59  Ht 5\' 9"  (1.753 m)  Wt 154 lb (69.854 kg)  BMI 22.73 kg/m2 Well nourished, well developed, in no acute distress HEENT: normal Neck: no JVD Cardiac:  normal S1, S2; RRR; no murmur Lungs:  clear to auscultation bilaterally, no wheezing, rhonchi or rales Abd: soft, nontender, no hepatomegaly Ext: trace edema Skin: warm and dry Neuro:  CNs 2-12 intact, no focal abnormalities noted   ASSESSMENT AND PLAN:  1.  PAF maintaining NSR - continue Diltiazem/metoprolol/warfarin  2. RBBB/LAFB  3. Dyslipidemia - LDL at goal at 62 on lipids 11/15/2013 - continue simvastatin  4. HTN  - well controlled - continue metoprolol/Diltiazem   5. Nonobstructive ASCAD - lexiscan myoview 11/14 with no ischemia - no angina 6. Chronic anticoagulation   Followup with me in 6 months   Signed, Fransico Him, MD [facility] 11/26/2013 8:32 AM

## 2013-11-26 ENCOUNTER — Encounter: Payer: Self-pay | Admitting: Cardiology

## 2013-11-26 ENCOUNTER — Ambulatory Visit (INDEPENDENT_AMBULATORY_CARE_PROVIDER_SITE_OTHER): Payer: Medicare Other | Admitting: Pharmacist

## 2013-11-26 ENCOUNTER — Ambulatory Visit (INDEPENDENT_AMBULATORY_CARE_PROVIDER_SITE_OTHER): Payer: Medicare Other | Admitting: Cardiology

## 2013-11-26 VITALS — BP 140/62 | HR 59 | Ht 69.0 in | Wt 154.0 lb

## 2013-11-26 DIAGNOSIS — I480 Paroxysmal atrial fibrillation: Secondary | ICD-10-CM

## 2013-11-26 DIAGNOSIS — I10 Essential (primary) hypertension: Secondary | ICD-10-CM

## 2013-11-26 DIAGNOSIS — Z7901 Long term (current) use of anticoagulants: Secondary | ICD-10-CM

## 2013-11-26 DIAGNOSIS — E785 Hyperlipidemia, unspecified: Secondary | ICD-10-CM

## 2013-11-26 DIAGNOSIS — I4891 Unspecified atrial fibrillation: Secondary | ICD-10-CM

## 2013-11-26 DIAGNOSIS — I2510 Atherosclerotic heart disease of native coronary artery without angina pectoris: Secondary | ICD-10-CM

## 2013-11-26 DIAGNOSIS — I452 Bifascicular block: Secondary | ICD-10-CM

## 2013-11-26 LAB — POCT INR: INR: 2

## 2013-11-26 NOTE — Patient Instructions (Signed)
Your physician recommends that you continue on your current medications as directed. Please refer to the Current Medication list given to you today.  Your physician wants you to follow-up in: 6 months. You will receive a reminder letter in the mail two months in advance. If you don't receive a letter, please call our office to schedule the follow-up appointment.  

## 2013-11-29 ENCOUNTER — Encounter: Payer: Self-pay | Admitting: Cardiology

## 2014-01-07 ENCOUNTER — Ambulatory Visit (INDEPENDENT_AMBULATORY_CARE_PROVIDER_SITE_OTHER): Payer: Medicare Other | Admitting: Pharmacist Clinician (PhC)/ Clinical Pharmacy Specialist

## 2014-01-07 DIAGNOSIS — I480 Paroxysmal atrial fibrillation: Secondary | ICD-10-CM

## 2014-01-07 DIAGNOSIS — I4891 Unspecified atrial fibrillation: Secondary | ICD-10-CM

## 2014-01-07 LAB — POCT INR: INR: 2.4

## 2014-02-10 ENCOUNTER — Ambulatory Visit (INDEPENDENT_AMBULATORY_CARE_PROVIDER_SITE_OTHER): Payer: Medicare Other | Admitting: Surgery

## 2014-02-10 DIAGNOSIS — I4891 Unspecified atrial fibrillation: Secondary | ICD-10-CM

## 2014-02-10 DIAGNOSIS — I480 Paroxysmal atrial fibrillation: Secondary | ICD-10-CM

## 2014-02-10 LAB — POCT INR: INR: 2.8

## 2014-03-12 ENCOUNTER — Encounter: Payer: Self-pay | Admitting: Cardiology

## 2014-03-24 ENCOUNTER — Ambulatory Visit (INDEPENDENT_AMBULATORY_CARE_PROVIDER_SITE_OTHER): Payer: Medicare Other | Admitting: Pharmacist

## 2014-03-24 DIAGNOSIS — I480 Paroxysmal atrial fibrillation: Secondary | ICD-10-CM

## 2014-03-24 DIAGNOSIS — I4891 Unspecified atrial fibrillation: Secondary | ICD-10-CM

## 2014-03-24 LAB — POCT INR: INR: 2.4

## 2014-04-28 ENCOUNTER — Ambulatory Visit (INDEPENDENT_AMBULATORY_CARE_PROVIDER_SITE_OTHER): Payer: Medicare Other

## 2014-04-28 DIAGNOSIS — I4891 Unspecified atrial fibrillation: Secondary | ICD-10-CM

## 2014-04-28 DIAGNOSIS — I480 Paroxysmal atrial fibrillation: Secondary | ICD-10-CM

## 2014-04-28 LAB — POCT INR: INR: 2.2

## 2014-05-27 NOTE — Progress Notes (Signed)
Cardiology Office Note   Date:  05/28/2014   ID:  NHUT NAQVI, DOB 79-Mar-1925, MRN [id number]  PCP:  Wenda Low, MD    Chief Complaint  Patient presents with  . Atrial Fibrillation  . Hypertension  . Hyperlipidemia      History of Present Illness: James Strong is a 81 y.o. male with a history of nonobstructive ASCAD, dyslipidemia, PAF, HTN and chronic anticoagulation who presents today for followup. He is doing well. He denies any chest pain or pressure, SOB, DOE, LE edema, dizziness, palpitations or syncope. He fishes for exercise. He is limited in his exercise due to bad knees.      Past Medical History  Diagnosis Date  . Prediabetes   . Hyperlipidemia   . Benign hypertensive heart disease without heart failure   . Melanoma of skin, site unspecified   . Anemia   . Iron deficiency   . Osteoarthrosis   . CKD (chronic kidney disease)   . Colon polyp   . PAF (paroxysmal atrial fibrillation)   . Chronic anticoagulation   . HTN (hypertension)   . Coronary atherosclerosis of native coronary artery     nonobstructive ASCAD - 30-40% mid LAD and 30% mid PL by cath 2006    Past Surgical History  Procedure Laterality Date  . Skin melonoma removed from scalp    . Melanoma excision      superficial on scalp     Current Outpatient Prescriptions  Medication Sig Dispense Refill  . diltiazem (TIAZAC) 120 MG 24 hr capsule Take 120 mg by mouth daily.    . metoprolol (LOPRESSOR) 50 MG tablet Take 50 mg by mouth 2 (two) times daily.    . simvastatin (ZOCOR) 20 MG tablet Take 1/2 tab daily    . warfarin (COUMADIN) 5 MG tablet Take 5 mg by mouth daily. As directed     No current facility-administered medications for this visit.    Allergies:   Review of patient's allergies indicates no known allergies.    Social History:  The patient  reports that he quit smoking about 44 years ago. He does not have any smokeless tobacco history on file. He reports that he drinks  alcohol.   Family History:  The patient's family history includes Dementia in his father.    ROS:  Please see the history of present illness.   Otherwise, review of systems are positive for none.   All other systems are reviewed and negative.    PHYSICAL EXAM: VS:  BP 160/78 mmHg  Pulse 55  Ht 5\' 9"  (1.753 m)  Wt 152 lb 12.8 oz (69.31 kg)  BMI 22.55 kg/m2 , BMI Body mass index is 22.55 kg/(m^2). GEN: Well nourished, well developed, in no acute distress HEENT: normal Neck: no JVD, carotid bruits, or masses Cardiac: RRR; no murmurs, rubs, or gallops.  Trace edema  Respiratory:  clear to auscultation bilaterally, normal work of breathing GI: soft, nontender, nondistended, + BS MS: no deformity or atrophy Skin: warm and dry, no rash Neuro:  Strength and sensation are intact Psych: euthymic mood, full affect   EKG:  EKG was ordered today and sinus bradycardia with RBBB and LAFB and first degree AV block    Recent Labs: No results found for requested labs within last 365 days.    Lipid Panel No results found for: CHOL, TRIG, HDL, CHOLHDL, VLDL, LDLCALC, LDLDIRECT    Wt Readings from Last 3 Encounters:  05/28/14 152 lb 12.8 oz (  69.31 kg)  11/26/13 154 lb (69.854 kg)  12/31/12 157 lb (71.215 kg)    ASSESSMENT AND PLAN:  1. PAF maintaining NSR - continue Diltiazem/metoprolol/warfarin  2. RBBB/LAFB  3. Dyslipidemia - LDL at goal at 62 on lipids 11/15/2013 - continue simvastatin  - check FLP and ALT 4. HTN - mildly elevated today but normal at Dr. Glenna Durand office on Monday.   - continue metoprolol/Diltiazem  5. Nonobstructive ASCAD - lexiscan myoview 11/14 with no ischemia - no angina.  No ASA due to warfarin 6. Chronic anticoagulation    Current medicines are reviewed at length with the patient today.  The patient does not have concerns regarding medicines.  The following changes have been made:  no change  Labs/ tests ordered today include: see above assessment  and plan  Orders Placed This Encounter  Procedures  . Hepatic function panel  . Lipid Profile  . EKG 12-Lead     Disposition:   FU with me in 6 months   Signed, Sueanne Margarita, MD  05/28/2014 12:14 PM    [facility] [place], [place], [place]  [address] Phone: [phone]; Fax: [phone]

## 2014-05-28 ENCOUNTER — Ambulatory Visit (INDEPENDENT_AMBULATORY_CARE_PROVIDER_SITE_OTHER): Payer: Medicare Other | Admitting: *Deleted

## 2014-05-28 ENCOUNTER — Encounter: Payer: Self-pay | Admitting: Cardiology

## 2014-05-28 ENCOUNTER — Ambulatory Visit (INDEPENDENT_AMBULATORY_CARE_PROVIDER_SITE_OTHER): Payer: Medicare Other | Admitting: Cardiology

## 2014-05-28 VITALS — BP 160/78 | HR 55 | Ht 69.0 in | Wt 152.8 lb

## 2014-05-28 DIAGNOSIS — I452 Bifascicular block: Secondary | ICD-10-CM

## 2014-05-28 DIAGNOSIS — I4891 Unspecified atrial fibrillation: Secondary | ICD-10-CM | POA: Diagnosis not present

## 2014-05-28 DIAGNOSIS — Z515 Encounter for palliative care: Secondary | ICD-10-CM | POA: Insufficient documentation

## 2014-05-28 DIAGNOSIS — I2510 Atherosclerotic heart disease of native coronary artery without angina pectoris: Secondary | ICD-10-CM

## 2014-05-28 DIAGNOSIS — I10 Essential (primary) hypertension: Secondary | ICD-10-CM

## 2014-05-28 DIAGNOSIS — I480 Paroxysmal atrial fibrillation: Secondary | ICD-10-CM | POA: Diagnosis not present

## 2014-05-28 DIAGNOSIS — E785 Hyperlipidemia, unspecified: Secondary | ICD-10-CM

## 2014-05-28 DIAGNOSIS — Z5181 Encounter for therapeutic drug level monitoring: Secondary | ICD-10-CM

## 2014-05-28 LAB — POCT INR: INR: 3

## 2014-05-28 NOTE — Patient Instructions (Signed)
Your physician recommends that you continue on your current medications as directed. Please refer to the Current Medication list given to you today.  Your physician recommends that you return for lab work in: 1 week (LFT, Lipids)  Your physician wants you to follow-up in: 6 months with Dr. Radford Pax. You will receive a reminder letter in the mail two months in advance. If you don't receive a letter, please call our office to schedule the follow-up appointment.

## 2014-06-03 ENCOUNTER — Other Ambulatory Visit (INDEPENDENT_AMBULATORY_CARE_PROVIDER_SITE_OTHER): Payer: Medicare Other

## 2014-06-03 DIAGNOSIS — I10 Essential (primary) hypertension: Secondary | ICD-10-CM

## 2014-06-03 DIAGNOSIS — E785 Hyperlipidemia, unspecified: Secondary | ICD-10-CM

## 2014-06-03 LAB — LIPID PANEL
CHOLESTEROL: 104 mg/dL (ref 0–200)
HDL: 28.7 mg/dL — AB (ref >39.00)
LDL Cholesterol: 56 mg/dL (ref 0–99)
NonHDL: 75.3
Total CHOL/HDL Ratio: 4
Triglycerides: 95 mg/dL (ref 0.0–149.0)
VLDL: 19 mg/dL (ref 0.0–40.0)

## 2014-06-03 LAB — HEPATIC FUNCTION PANEL
ALBUMIN: 3.5 g/dL (ref 3.5–5.2)
ALT: 17 U/L (ref 0–53)
AST: 24 U/L (ref 0–37)
Alkaline Phosphatase: 86 U/L (ref 39–117)
Bilirubin, Direct: 0.2 mg/dL (ref 0.0–0.3)
TOTAL PROTEIN: 7.2 g/dL (ref 6.0–8.3)
Total Bilirubin: 0.8 mg/dL (ref 0.2–1.2)

## 2014-07-09 ENCOUNTER — Ambulatory Visit (INDEPENDENT_AMBULATORY_CARE_PROVIDER_SITE_OTHER): Payer: Medicare Other | Admitting: *Deleted

## 2014-07-09 DIAGNOSIS — I4891 Unspecified atrial fibrillation: Secondary | ICD-10-CM | POA: Diagnosis not present

## 2014-07-09 DIAGNOSIS — Z5181 Encounter for therapeutic drug level monitoring: Secondary | ICD-10-CM

## 2014-07-09 DIAGNOSIS — I480 Paroxysmal atrial fibrillation: Secondary | ICD-10-CM

## 2014-07-09 LAB — POCT INR: INR: 2.4

## 2014-08-20 ENCOUNTER — Ambulatory Visit (INDEPENDENT_AMBULATORY_CARE_PROVIDER_SITE_OTHER): Payer: Medicare Other | Admitting: *Deleted

## 2014-08-20 DIAGNOSIS — I4891 Unspecified atrial fibrillation: Secondary | ICD-10-CM | POA: Diagnosis not present

## 2014-08-20 DIAGNOSIS — Z5181 Encounter for therapeutic drug level monitoring: Secondary | ICD-10-CM | POA: Diagnosis not present

## 2014-08-20 DIAGNOSIS — I480 Paroxysmal atrial fibrillation: Secondary | ICD-10-CM | POA: Diagnosis not present

## 2014-08-20 LAB — POCT INR: INR: 2.3

## 2014-09-24 ENCOUNTER — Encounter: Payer: Self-pay | Admitting: Cardiology

## 2014-10-01 ENCOUNTER — Ambulatory Visit (INDEPENDENT_AMBULATORY_CARE_PROVIDER_SITE_OTHER): Payer: Medicare Other | Admitting: Pharmacist

## 2014-10-01 DIAGNOSIS — Z5181 Encounter for therapeutic drug level monitoring: Secondary | ICD-10-CM

## 2014-10-01 DIAGNOSIS — I4891 Unspecified atrial fibrillation: Secondary | ICD-10-CM

## 2014-10-01 DIAGNOSIS — I480 Paroxysmal atrial fibrillation: Secondary | ICD-10-CM

## 2014-10-01 LAB — POCT INR: INR: 2.3

## 2014-11-12 ENCOUNTER — Ambulatory Visit (INDEPENDENT_AMBULATORY_CARE_PROVIDER_SITE_OTHER): Payer: Medicare Other | Admitting: Pharmacist

## 2014-11-12 DIAGNOSIS — I480 Paroxysmal atrial fibrillation: Secondary | ICD-10-CM | POA: Diagnosis not present

## 2014-11-12 DIAGNOSIS — Z5181 Encounter for therapeutic drug level monitoring: Secondary | ICD-10-CM

## 2014-11-12 DIAGNOSIS — I4891 Unspecified atrial fibrillation: Secondary | ICD-10-CM

## 2014-11-12 LAB — POCT INR: INR: 2.2

## 2014-12-10 ENCOUNTER — Ambulatory Visit (INDEPENDENT_AMBULATORY_CARE_PROVIDER_SITE_OTHER): Payer: Medicare Other | Admitting: Cardiology

## 2014-12-10 ENCOUNTER — Encounter: Payer: Self-pay | Admitting: Cardiology

## 2014-12-10 VITALS — BP 132/50 | HR 70 | Ht 69.0 in | Wt 151.0 lb

## 2014-12-10 DIAGNOSIS — E785 Hyperlipidemia, unspecified: Secondary | ICD-10-CM

## 2014-12-10 DIAGNOSIS — I2510 Atherosclerotic heart disease of native coronary artery without angina pectoris: Secondary | ICD-10-CM | POA: Diagnosis not present

## 2014-12-10 DIAGNOSIS — I480 Paroxysmal atrial fibrillation: Secondary | ICD-10-CM

## 2014-12-10 DIAGNOSIS — I10 Essential (primary) hypertension: Secondary | ICD-10-CM

## 2014-12-10 NOTE — Patient Instructions (Signed)

## 2014-12-10 NOTE — Progress Notes (Addendum)
Cardiology Office Note   Date:  12/10/2014   ID:  OUSMANE Strong, DOB 09-Jun-1923, MRN [id number]  PCP:  James Low, MD    Chief Complaint  Patient presents with  . Coronary Artery Disease  . Atrial Fibrillation  . Hypertension      History of Present Illness: James Strong is a 81 y.o. male with a history of nonobstructive ASCAD, dyslipidemia, PAF, HTN and chronic anticoagulation who presents today for followup. He is doing well. He denies any chest pain or pressure, SOB, DOE, LE edema, dizziness, palpitations or syncope. He fishes for exercise. He is limited in his exercise due to bad knees.      Past Medical History  Diagnosis Date  . Prediabetes   . Hyperlipidemia   . Benign hypertensive heart disease without heart failure   . Melanoma of skin, site unspecified   . Anemia   . Iron deficiency   . Osteoarthrosis   . CKD (chronic kidney disease)   . Colon polyp   . PAF (paroxysmal atrial fibrillation) ([place])   . Chronic anticoagulation   . HTN (hypertension)   . Coronary atherosclerosis of native coronary artery     nonobstructive ASCAD - 30-40% mid LAD and 30% mid PL by cath 2006    Past Surgical History  Procedure Laterality Date  . Skin melonoma removed from scalp    . Melanoma excision      superficial on scalp     Current Outpatient Prescriptions  Medication Sig Dispense Refill  . diltiazem (TIAZAC) 120 MG 24 hr capsule Take 120 mg by mouth daily.    . metoprolol (LOPRESSOR) 50 MG tablet Take 50 mg by mouth 2 (two) times daily.    . simvastatin (ZOCOR) 20 MG tablet Take 1/2 tab daily    . warfarin (COUMADIN) 5 MG tablet Take 5 mg by mouth daily. As directed     No current facility-administered medications for this visit.    Allergies:   Review of patient's allergies indicates no known allergies.    Social History:  The patient  reports that he quit smoking about 45 years ago. He does not have any smokeless tobacco history  on file. He reports that he drinks alcohol.   Family History:  The patient's family history includes Dementia in his father.    ROS:  Please see the history of present illness.   Otherwise, review of systems are positive for none.   All other systems are reviewed and negative.    PHYSICAL EXAM: VS:  BP 132/50 mmHg  Pulse 70  Ht 5\' 9"  (1.753 m)  Wt 151 lb (68.493 kg)  BMI 22.29 kg/m2 , BMI Body mass index is 22.29 kg/(m^2). GEN: Well nourished, well developed, in no acute distress HEENT: normal Neck: no JVD, carotid bruits, or masses Cardiac: RRR; no murmurs, rubs, or gallops,no edema  Respiratory:  clear to auscultation bilaterally, normal work of breathing GI: soft, nontender, nondistended, + BS MS: no deformity or atrophy Skin: warm and dry, no rash Neuro:  Strength and sensation are intact Psych: euthymic mood, full affect   EKG:  EKG is not ordered today.    Recent Labs: 06/03/2014: ALT 17    Lipid Panel    Component Value Date/Time   CHOL 104 06/03/2014 0847   TRIG 95.0 06/03/2014 0847   HDL 28.70* 06/03/2014 0847   CHOLHDL 4 06/03/2014 0847  VLDL 19.0 06/03/2014 0847   LDLCALC 56 06/03/2014 0847      Wt Readings from Last 3 Encounters:  12/10/14 151 lb (68.493 kg)  05/28/14 152 lb 12.8 oz (69.31 kg)  11/26/13 154 lb (69.854 kg)    ASSESSMENT AND PLAN:  1. PAF maintaining NSR - continue Diltiazem/metoprolol/warfarin  2. RBBB/LAFB  3. Dyslipidemia - LDL at goal at 62 on lipids 11/15/2013 - continue simvastatin  4. HTN - BP controlled - continue metoprolol/Diltiazem  5. Nonobstructive ASCAD - lexiscan myoview 11/14 with no ischemia - no angina. No ASA due to warfarin 6. Chronic anticoagulation      Current medicines are reviewed at length with the patient today.  The patient does not have concerns regarding medicines.  The following changes have been made:  no change  Labs/ tests ordered today: See above Assessment and Plan No orders of the  defined types were placed in this encounter.     Disposition:   FU with me in 6 months  Signed, Sueanne Margarita, MD  12/10/2014 2:49 PM    [facility] [place], [place], [place]  [address] Phone: [phone]; Fax: [phone]

## 2014-12-24 ENCOUNTER — Ambulatory Visit (INDEPENDENT_AMBULATORY_CARE_PROVIDER_SITE_OTHER): Payer: Medicare Other | Admitting: *Deleted

## 2014-12-24 DIAGNOSIS — I480 Paroxysmal atrial fibrillation: Secondary | ICD-10-CM

## 2014-12-24 DIAGNOSIS — I4891 Unspecified atrial fibrillation: Secondary | ICD-10-CM | POA: Diagnosis not present

## 2014-12-24 DIAGNOSIS — Z5181 Encounter for therapeutic drug level monitoring: Secondary | ICD-10-CM | POA: Diagnosis not present

## 2014-12-24 LAB — POCT INR: INR: 2

## 2015-01-27 ENCOUNTER — Ambulatory Visit (INDEPENDENT_AMBULATORY_CARE_PROVIDER_SITE_OTHER): Payer: Medicare Other | Admitting: *Deleted

## 2015-01-27 DIAGNOSIS — Z5181 Encounter for therapeutic drug level monitoring: Secondary | ICD-10-CM

## 2015-01-27 DIAGNOSIS — I480 Paroxysmal atrial fibrillation: Secondary | ICD-10-CM

## 2015-01-27 DIAGNOSIS — I4891 Unspecified atrial fibrillation: Secondary | ICD-10-CM | POA: Diagnosis not present

## 2015-01-27 LAB — POCT INR: INR: 2

## 2015-03-10 ENCOUNTER — Ambulatory Visit (INDEPENDENT_AMBULATORY_CARE_PROVIDER_SITE_OTHER): Payer: Medicare Other | Admitting: *Deleted

## 2015-03-10 DIAGNOSIS — I480 Paroxysmal atrial fibrillation: Secondary | ICD-10-CM

## 2015-03-10 DIAGNOSIS — I4891 Unspecified atrial fibrillation: Secondary | ICD-10-CM | POA: Diagnosis not present

## 2015-03-10 DIAGNOSIS — Z5181 Encounter for therapeutic drug level monitoring: Secondary | ICD-10-CM

## 2015-03-10 LAB — POCT INR: INR: 2

## 2015-04-21 ENCOUNTER — Ambulatory Visit (INDEPENDENT_AMBULATORY_CARE_PROVIDER_SITE_OTHER): Payer: Medicare Other | Admitting: *Deleted

## 2015-04-21 DIAGNOSIS — Z5181 Encounter for therapeutic drug level monitoring: Secondary | ICD-10-CM

## 2015-04-21 DIAGNOSIS — I4891 Unspecified atrial fibrillation: Secondary | ICD-10-CM

## 2015-04-21 DIAGNOSIS — I480 Paroxysmal atrial fibrillation: Secondary | ICD-10-CM

## 2015-04-21 LAB — POCT INR: INR: 1.9

## 2015-05-19 ENCOUNTER — Ambulatory Visit (INDEPENDENT_AMBULATORY_CARE_PROVIDER_SITE_OTHER): Payer: Medicare Other | Admitting: *Deleted

## 2015-05-19 DIAGNOSIS — Z5181 Encounter for therapeutic drug level monitoring: Secondary | ICD-10-CM | POA: Diagnosis not present

## 2015-05-19 DIAGNOSIS — I480 Paroxysmal atrial fibrillation: Secondary | ICD-10-CM

## 2015-05-19 DIAGNOSIS — I4891 Unspecified atrial fibrillation: Secondary | ICD-10-CM | POA: Diagnosis not present

## 2015-05-19 LAB — POCT INR: INR: 2.6

## 2015-06-10 DIAGNOSIS — N183 Chronic kidney disease, stage 3 (moderate): Secondary | ICD-10-CM | POA: Diagnosis not present

## 2015-06-10 DIAGNOSIS — C439 Malignant melanoma of skin, unspecified: Secondary | ICD-10-CM | POA: Diagnosis not present

## 2015-06-10 DIAGNOSIS — I10 Essential (primary) hypertension: Secondary | ICD-10-CM | POA: Diagnosis not present

## 2015-06-10 DIAGNOSIS — I482 Chronic atrial fibrillation: Secondary | ICD-10-CM | POA: Diagnosis not present

## 2015-06-10 DIAGNOSIS — E7800 Pure hypercholesterolemia, unspecified: Secondary | ICD-10-CM | POA: Diagnosis not present

## 2015-06-10 DIAGNOSIS — E46 Unspecified protein-calorie malnutrition: Secondary | ICD-10-CM | POA: Diagnosis not present

## 2015-06-22 ENCOUNTER — Ambulatory Visit: Payer: Medicare Other | Admitting: Cardiology

## 2015-06-22 ENCOUNTER — Ambulatory Visit (INDEPENDENT_AMBULATORY_CARE_PROVIDER_SITE_OTHER): Payer: Medicare Other | Admitting: Pharmacist

## 2015-06-22 DIAGNOSIS — Z5181 Encounter for therapeutic drug level monitoring: Secondary | ICD-10-CM | POA: Diagnosis not present

## 2015-06-22 DIAGNOSIS — I480 Paroxysmal atrial fibrillation: Secondary | ICD-10-CM

## 2015-06-22 DIAGNOSIS — I4891 Unspecified atrial fibrillation: Secondary | ICD-10-CM | POA: Diagnosis not present

## 2015-06-22 LAB — POCT INR: INR: 2

## 2015-06-24 DIAGNOSIS — C44629 Squamous cell carcinoma of skin of left upper limb, including shoulder: Secondary | ICD-10-CM | POA: Diagnosis not present

## 2015-06-24 DIAGNOSIS — L570 Actinic keratosis: Secondary | ICD-10-CM | POA: Diagnosis not present

## 2015-08-03 ENCOUNTER — Ambulatory Visit (INDEPENDENT_AMBULATORY_CARE_PROVIDER_SITE_OTHER): Payer: Medicare Other | Admitting: *Deleted

## 2015-08-03 DIAGNOSIS — I480 Paroxysmal atrial fibrillation: Secondary | ICD-10-CM | POA: Diagnosis not present

## 2015-08-03 DIAGNOSIS — I4891 Unspecified atrial fibrillation: Secondary | ICD-10-CM | POA: Diagnosis not present

## 2015-08-03 DIAGNOSIS — Z5181 Encounter for therapeutic drug level monitoring: Secondary | ICD-10-CM | POA: Diagnosis not present

## 2015-08-03 LAB — POCT INR: INR: 2.2

## 2015-08-19 DIAGNOSIS — Z85828 Personal history of other malignant neoplasm of skin: Secondary | ICD-10-CM | POA: Diagnosis not present

## 2015-09-15 ENCOUNTER — Ambulatory Visit (INDEPENDENT_AMBULATORY_CARE_PROVIDER_SITE_OTHER): Payer: Medicare Other

## 2015-09-15 DIAGNOSIS — I480 Paroxysmal atrial fibrillation: Secondary | ICD-10-CM | POA: Diagnosis not present

## 2015-09-15 DIAGNOSIS — I4891 Unspecified atrial fibrillation: Secondary | ICD-10-CM | POA: Diagnosis not present

## 2015-09-15 DIAGNOSIS — Z5181 Encounter for therapeutic drug level monitoring: Secondary | ICD-10-CM

## 2015-09-15 LAB — POCT INR: INR: 2.3

## 2015-10-27 ENCOUNTER — Ambulatory Visit (INDEPENDENT_AMBULATORY_CARE_PROVIDER_SITE_OTHER): Payer: Medicare Other | Admitting: *Deleted

## 2015-10-27 DIAGNOSIS — I4891 Unspecified atrial fibrillation: Secondary | ICD-10-CM | POA: Diagnosis not present

## 2015-10-27 DIAGNOSIS — I480 Paroxysmal atrial fibrillation: Secondary | ICD-10-CM

## 2015-10-27 DIAGNOSIS — Z5181 Encounter for therapeutic drug level monitoring: Secondary | ICD-10-CM | POA: Diagnosis not present

## 2015-10-27 LAB — POCT INR: INR: 2.3

## 2015-11-18 DIAGNOSIS — C44311 Basal cell carcinoma of skin of nose: Secondary | ICD-10-CM | POA: Diagnosis not present

## 2015-11-18 DIAGNOSIS — Z85828 Personal history of other malignant neoplasm of skin: Secondary | ICD-10-CM | POA: Diagnosis not present

## 2015-12-11 ENCOUNTER — Encounter: Payer: Self-pay | Admitting: Cardiology

## 2015-12-11 DIAGNOSIS — C439 Malignant melanoma of skin, unspecified: Secondary | ICD-10-CM | POA: Diagnosis not present

## 2015-12-11 DIAGNOSIS — E782 Mixed hyperlipidemia: Secondary | ICD-10-CM | POA: Diagnosis not present

## 2015-12-11 DIAGNOSIS — Z0000 Encounter for general adult medical examination without abnormal findings: Secondary | ICD-10-CM | POA: Diagnosis not present

## 2015-12-11 DIAGNOSIS — N183 Chronic kidney disease, stage 3 (moderate): Secondary | ICD-10-CM | POA: Diagnosis not present

## 2015-12-11 DIAGNOSIS — G4700 Insomnia, unspecified: Secondary | ICD-10-CM | POA: Diagnosis not present

## 2015-12-11 DIAGNOSIS — M1990 Unspecified osteoarthritis, unspecified site: Secondary | ICD-10-CM | POA: Diagnosis not present

## 2015-12-11 DIAGNOSIS — E46 Unspecified protein-calorie malnutrition: Secondary | ICD-10-CM | POA: Diagnosis not present

## 2015-12-11 DIAGNOSIS — I482 Chronic atrial fibrillation: Secondary | ICD-10-CM | POA: Diagnosis not present

## 2015-12-11 DIAGNOSIS — C4491 Basal cell carcinoma of skin, unspecified: Secondary | ICD-10-CM | POA: Diagnosis not present

## 2015-12-11 DIAGNOSIS — I10 Essential (primary) hypertension: Secondary | ICD-10-CM | POA: Diagnosis not present

## 2015-12-18 ENCOUNTER — Ambulatory Visit (INDEPENDENT_AMBULATORY_CARE_PROVIDER_SITE_OTHER): Payer: Medicare Other | Admitting: Cardiology

## 2015-12-18 ENCOUNTER — Ambulatory Visit (INDEPENDENT_AMBULATORY_CARE_PROVIDER_SITE_OTHER): Payer: Medicare Other | Admitting: *Deleted

## 2015-12-18 ENCOUNTER — Encounter (INDEPENDENT_AMBULATORY_CARE_PROVIDER_SITE_OTHER): Payer: Self-pay

## 2015-12-18 VITALS — BP 154/76 | HR 60 | Ht 69.0 in | Wt 143.4 lb

## 2015-12-18 DIAGNOSIS — I10 Essential (primary) hypertension: Secondary | ICD-10-CM

## 2015-12-18 DIAGNOSIS — I4891 Unspecified atrial fibrillation: Secondary | ICD-10-CM | POA: Diagnosis not present

## 2015-12-18 DIAGNOSIS — I2510 Atherosclerotic heart disease of native coronary artery without angina pectoris: Secondary | ICD-10-CM

## 2015-12-18 DIAGNOSIS — I481 Persistent atrial fibrillation: Secondary | ICD-10-CM | POA: Diagnosis not present

## 2015-12-18 DIAGNOSIS — I4819 Other persistent atrial fibrillation: Secondary | ICD-10-CM

## 2015-12-18 DIAGNOSIS — E7800 Pure hypercholesterolemia, unspecified: Secondary | ICD-10-CM | POA: Diagnosis not present

## 2015-12-18 DIAGNOSIS — I480 Paroxysmal atrial fibrillation: Secondary | ICD-10-CM

## 2015-12-18 DIAGNOSIS — Z5181 Encounter for therapeutic drug level monitoring: Secondary | ICD-10-CM

## 2015-12-18 LAB — POCT INR: INR: 3.1

## 2015-12-18 NOTE — Progress Notes (Signed)
Cardiology Office Note    Date:  12/18/2015   ID:  James Strong, DOB 02/28/1923, MRN [id number]  PCP:  Wenda Low, MD  Cardiologist:  Fransico Him, MD   Chief Complaint  Patient presents with  . Coronary Artery Disease  . Hypertension  . Hyperlipidemia    History of Present Illness:  James Strong is a 82 y.o. male with a history of nonobstructive ASCAD, dyslipidemia, PAF, HTN and chronic anticoagulation who presents today for followup. He is doing well. He denies any chest pain or pressure, SOB, DOE, LE edema, dizziness, palpitations or syncope. He fishes for exercise.     Past Medical History:  Diagnosis Date  . Anemia   . Benign hypertensive heart disease without heart failure   . Chronic anticoagulation   . CKD (chronic kidney disease)   . Colon polyp   . Coronary atherosclerosis of native coronary artery    nonobstructive ASCAD - 30-40% mid LAD and 30% mid PL by cath 2006  . HTN (hypertension)   . Hyperlipidemia   . Iron deficiency   . Melanoma of skin, site unspecified ([place])   . Osteoarthrosis   . PAF (paroxysmal atrial fibrillation) ([place] [facility])   . Prediabetes     Past Surgical History:  Procedure Laterality Date  . MELANOMA EXCISION     superficial on scalp  . skin melonoma removed from scalp      Current Medications: Outpatient Medications Prior to Visit  Medication Sig Dispense Refill  . diltiazem (TIAZAC) 120 MG 24 hr capsule Take 120 mg by mouth daily.    . metoprolol (LOPRESSOR) 50 MG tablet Take 50 mg by mouth 2 (two) times daily.    . mirtazapine (REMERON) 15 MG tablet Take 15 mg by mouth at bedtime.    . simvastatin (ZOCOR) 20 MG tablet Take 10 mg by mouth daily. Take 1/2 tablet by mouth daily    . warfarin (COUMADIN) 5 MG tablet Take 5 mg by mouth daily. As directed     No facility-administered medications prior to visit.      Allergies:   Review of patient's allergies indicates no known allergies.   Social History   Social History    . Marital status: Single    Spouse name: N/A  . Number of children: N/A  . Years of education: N/A   Social History Main Topics  . Smoking status: Former Smoker    Quit date: 12/12/1969  . Smokeless tobacco: Not on file  . Alcohol use Yes     Comment: occasionally  . Drug use: Unknown  . Sexual activity: Not on file   Other Topics Concern  . Not on file   Social History Narrative  . No narrative on file     Family History:  The patient's family history includes Dementia in his father.   ROS:   Please see the history of present illness.    ROS All other systems reviewed and are negative.  No flowsheet data found.     PHYSICAL EXAM:   VS:  BP (!) 154/76   Pulse 60   Ht 5\' 9"  (1.753 m)   Wt 143 lb 6.4 oz (65 kg)   BMI 21.18 kg/m    GEN: Well nourished, well developed, in no acute distress  HEENT: normal  Neck: no JVD, carotid bruits, or masses Cardiac: RRR; no murmurs, rubs, or gallops,no edema.  Intact distal pulses bilaterally.  Respiratory:  clear to auscultation bilaterally, normal work of breathing  GI: soft, nontender, nondistended, + BS MS: no deformity or atrophy  Skin: warm and dry, no rash Neuro:  Alert and Oriented x 3, Strength and sensation are intact Psych: euthymic mood, full affect  Wt Readings from Last 3 Encounters:  12/18/15 143 lb 6.4 oz (65 kg)  12/10/14 151 lb (68.5 kg)  05/28/14 152 lb 12.8 oz (69.3 kg)      Studies/Labs Reviewed:   EKG:  EKG is  ordered today and showed sinus bradycardia at 59bpm with RBBB and LAFB  Recent Labs: No results found for requested labs within last 8760 hours.   Lipid Panel    Component Value Date/Time   CHOL 104 06/03/2014 0847   TRIG 95.0 06/03/2014 0847   HDL 28.70 (L) 06/03/2014 0847   CHOLHDL 4 06/03/2014 0847   VLDL 19.0 06/03/2014 0847   LDLCALC 56 06/03/2014 0847    Additional studies/ records that were reviewed today include:  none    ASSESSMENT:    1. Atherosclerosis of native  coronary artery of native heart without angina pectoris   2. Essential hypertension   3. Persistent atrial fibrillation ([place])   4. Pure hypercholesterolemia      PLAN:  In order of problems listed above:  1. Non obstructive ASCAD - he has not had any anginal symptoms.  He is not on ASA due to warfarin. Continue statin and BB. 2. HTN - BP controlled on current meds.  Continue CCB/BB. 3. Persistent atrial fibrillation - maintaining NSR. Continue BB/CCB and warfarin. 4. Hyperlipidemia - LDL goal < 70. Continue statin.  LDL goal < 70.  Check FLP and ALT.    Medication Adjustments/Labs and Tests Ordered: Current medicines are reviewed at length with the patient today.  Concerns regarding medicines are outlined above.  Medication changes, Labs and Tests ordered today are listed in the Patient Instructions below.  There are no Patient Instructions on file for this visit.   Signed, Fransico Him, MD  12/18/2015 1:50 PM    [facility] [address], [place], [place]  [address] Phone: [phone]; Fax: [phone]

## 2015-12-18 NOTE — Patient Instructions (Signed)
Medication Instructions:  Your physician recommends that you continue on your current medications as directed. Please refer to the Current Medication list given to you today.   Labwork: None  Testing/Procedures: None  Follow-Up: Your physician wants you to follow-up in: 6 months with Dr. Theodosia Blender assistant. You will receive a reminder letter in the mail two months in advance. If you don't receive a letter, please call our office to schedule the follow-up appointment.   Your physician wants you to follow-up in: 1 year with Dr. Radford Pax. You will receive a reminder letter in the mail two months in advance. If you don't receive a letter, please call our office to schedule the follow-up appointment.   Any Other Special Instructions Will Be Listed Below (If Applicable).     If you need a refill on your cardiac medications before your next appointment, please call your pharmacy.

## 2016-01-06 ENCOUNTER — Ambulatory Visit (INDEPENDENT_AMBULATORY_CARE_PROVIDER_SITE_OTHER): Payer: Medicare Other | Admitting: *Deleted

## 2016-01-06 DIAGNOSIS — I481 Persistent atrial fibrillation: Secondary | ICD-10-CM | POA: Diagnosis not present

## 2016-01-06 DIAGNOSIS — Z85828 Personal history of other malignant neoplasm of skin: Secondary | ICD-10-CM | POA: Diagnosis not present

## 2016-01-06 DIAGNOSIS — Z5181 Encounter for therapeutic drug level monitoring: Secondary | ICD-10-CM

## 2016-01-06 DIAGNOSIS — I4819 Other persistent atrial fibrillation: Secondary | ICD-10-CM

## 2016-01-06 DIAGNOSIS — I4891 Unspecified atrial fibrillation: Secondary | ICD-10-CM

## 2016-01-06 DIAGNOSIS — C44319 Basal cell carcinoma of skin of other parts of face: Secondary | ICD-10-CM | POA: Diagnosis not present

## 2016-01-06 DIAGNOSIS — C44311 Basal cell carcinoma of skin of nose: Secondary | ICD-10-CM | POA: Diagnosis not present

## 2016-01-06 LAB — POCT INR: INR: 2.1

## 2016-02-10 DIAGNOSIS — L570 Actinic keratosis: Secondary | ICD-10-CM | POA: Diagnosis not present

## 2016-02-10 DIAGNOSIS — L905 Scar conditions and fibrosis of skin: Secondary | ICD-10-CM | POA: Diagnosis not present

## 2016-02-10 DIAGNOSIS — Z85828 Personal history of other malignant neoplasm of skin: Secondary | ICD-10-CM | POA: Diagnosis not present

## 2016-02-17 ENCOUNTER — Ambulatory Visit (INDEPENDENT_AMBULATORY_CARE_PROVIDER_SITE_OTHER): Payer: Medicare Other | Admitting: *Deleted

## 2016-02-17 DIAGNOSIS — I481 Persistent atrial fibrillation: Secondary | ICD-10-CM | POA: Diagnosis not present

## 2016-02-17 DIAGNOSIS — Z5181 Encounter for therapeutic drug level monitoring: Secondary | ICD-10-CM | POA: Diagnosis not present

## 2016-02-17 DIAGNOSIS — I4819 Other persistent atrial fibrillation: Secondary | ICD-10-CM

## 2016-02-17 DIAGNOSIS — I4891 Unspecified atrial fibrillation: Secondary | ICD-10-CM | POA: Diagnosis not present

## 2016-02-17 LAB — POCT INR: INR: 2.2

## 2016-03-15 DIAGNOSIS — I482 Chronic atrial fibrillation: Secondary | ICD-10-CM | POA: Diagnosis not present

## 2016-03-15 DIAGNOSIS — E46 Unspecified protein-calorie malnutrition: Secondary | ICD-10-CM | POA: Diagnosis not present

## 2016-03-15 DIAGNOSIS — C4491 Basal cell carcinoma of skin, unspecified: Secondary | ICD-10-CM | POA: Diagnosis not present

## 2016-03-15 DIAGNOSIS — J310 Chronic rhinitis: Secondary | ICD-10-CM | POA: Diagnosis not present

## 2016-03-15 DIAGNOSIS — I10 Essential (primary) hypertension: Secondary | ICD-10-CM | POA: Diagnosis not present

## 2016-03-15 DIAGNOSIS — C439 Malignant melanoma of skin, unspecified: Secondary | ICD-10-CM | POA: Diagnosis not present

## 2016-03-15 DIAGNOSIS — N183 Chronic kidney disease, stage 3 (moderate): Secondary | ICD-10-CM | POA: Diagnosis not present

## 2016-03-30 ENCOUNTER — Ambulatory Visit (INDEPENDENT_AMBULATORY_CARE_PROVIDER_SITE_OTHER): Payer: Medicare Other | Admitting: *Deleted

## 2016-03-30 DIAGNOSIS — Z5181 Encounter for therapeutic drug level monitoring: Secondary | ICD-10-CM | POA: Diagnosis not present

## 2016-03-30 DIAGNOSIS — I481 Persistent atrial fibrillation: Secondary | ICD-10-CM

## 2016-03-30 DIAGNOSIS — I4891 Unspecified atrial fibrillation: Secondary | ICD-10-CM | POA: Diagnosis not present

## 2016-03-30 DIAGNOSIS — I4819 Other persistent atrial fibrillation: Secondary | ICD-10-CM

## 2016-03-30 LAB — POCT INR: INR: 2

## 2016-04-06 DIAGNOSIS — L821 Other seborrheic keratosis: Secondary | ICD-10-CM | POA: Diagnosis not present

## 2016-04-06 DIAGNOSIS — L578 Other skin changes due to chronic exposure to nonionizing radiation: Secondary | ICD-10-CM | POA: Diagnosis not present

## 2016-04-06 DIAGNOSIS — L905 Scar conditions and fibrosis of skin: Secondary | ICD-10-CM | POA: Diagnosis not present

## 2016-05-11 ENCOUNTER — Ambulatory Visit (INDEPENDENT_AMBULATORY_CARE_PROVIDER_SITE_OTHER): Payer: Medicare Other | Admitting: *Deleted

## 2016-05-11 DIAGNOSIS — I4891 Unspecified atrial fibrillation: Secondary | ICD-10-CM

## 2016-05-11 DIAGNOSIS — Z5181 Encounter for therapeutic drug level monitoring: Secondary | ICD-10-CM | POA: Diagnosis not present

## 2016-05-11 DIAGNOSIS — I481 Persistent atrial fibrillation: Secondary | ICD-10-CM | POA: Diagnosis not present

## 2016-05-11 DIAGNOSIS — I4819 Other persistent atrial fibrillation: Secondary | ICD-10-CM

## 2016-05-11 LAB — POCT INR: INR: 2.6

## 2016-06-08 DIAGNOSIS — L570 Actinic keratosis: Secondary | ICD-10-CM | POA: Diagnosis not present

## 2016-06-08 DIAGNOSIS — Z85820 Personal history of malignant melanoma of skin: Secondary | ICD-10-CM | POA: Diagnosis not present

## 2016-06-08 DIAGNOSIS — L814 Other melanin hyperpigmentation: Secondary | ICD-10-CM | POA: Diagnosis not present

## 2016-06-08 DIAGNOSIS — L821 Other seborrheic keratosis: Secondary | ICD-10-CM | POA: Diagnosis not present

## 2016-06-22 ENCOUNTER — Ambulatory Visit (INDEPENDENT_AMBULATORY_CARE_PROVIDER_SITE_OTHER): Payer: Medicare Other | Admitting: Pharmacist

## 2016-06-22 DIAGNOSIS — Z5181 Encounter for therapeutic drug level monitoring: Secondary | ICD-10-CM | POA: Diagnosis not present

## 2016-06-22 DIAGNOSIS — I4819 Other persistent atrial fibrillation: Secondary | ICD-10-CM

## 2016-06-22 DIAGNOSIS — I4891 Unspecified atrial fibrillation: Secondary | ICD-10-CM

## 2016-06-22 DIAGNOSIS — I481 Persistent atrial fibrillation: Secondary | ICD-10-CM

## 2016-06-22 LAB — POCT INR: INR: 2.5

## 2016-07-08 DIAGNOSIS — I482 Chronic atrial fibrillation: Secondary | ICD-10-CM | POA: Diagnosis not present

## 2016-07-08 DIAGNOSIS — E782 Mixed hyperlipidemia: Secondary | ICD-10-CM | POA: Diagnosis not present

## 2016-07-08 DIAGNOSIS — N183 Chronic kidney disease, stage 3 (moderate): Secondary | ICD-10-CM | POA: Diagnosis not present

## 2016-07-08 DIAGNOSIS — I10 Essential (primary) hypertension: Secondary | ICD-10-CM | POA: Diagnosis not present

## 2016-08-03 ENCOUNTER — Encounter (INDEPENDENT_AMBULATORY_CARE_PROVIDER_SITE_OTHER): Payer: Self-pay

## 2016-08-03 ENCOUNTER — Ambulatory Visit (INDEPENDENT_AMBULATORY_CARE_PROVIDER_SITE_OTHER): Payer: Medicare Other | Admitting: *Deleted

## 2016-08-03 DIAGNOSIS — I4891 Unspecified atrial fibrillation: Secondary | ICD-10-CM | POA: Diagnosis not present

## 2016-08-03 DIAGNOSIS — Z5181 Encounter for therapeutic drug level monitoring: Secondary | ICD-10-CM

## 2016-08-03 DIAGNOSIS — I4819 Other persistent atrial fibrillation: Secondary | ICD-10-CM

## 2016-08-03 DIAGNOSIS — I481 Persistent atrial fibrillation: Secondary | ICD-10-CM | POA: Diagnosis not present

## 2016-08-03 LAB — POCT INR: INR: 1.8

## 2016-08-31 ENCOUNTER — Ambulatory Visit (INDEPENDENT_AMBULATORY_CARE_PROVIDER_SITE_OTHER): Payer: Medicare Other | Admitting: *Deleted

## 2016-08-31 DIAGNOSIS — I4891 Unspecified atrial fibrillation: Secondary | ICD-10-CM

## 2016-08-31 DIAGNOSIS — Z5181 Encounter for therapeutic drug level monitoring: Secondary | ICD-10-CM | POA: Diagnosis not present

## 2016-08-31 DIAGNOSIS — I4819 Other persistent atrial fibrillation: Secondary | ICD-10-CM

## 2016-08-31 DIAGNOSIS — I481 Persistent atrial fibrillation: Secondary | ICD-10-CM

## 2016-08-31 LAB — POCT INR: INR: 3.2

## 2016-09-21 ENCOUNTER — Ambulatory Visit (INDEPENDENT_AMBULATORY_CARE_PROVIDER_SITE_OTHER): Payer: Medicare Other | Admitting: *Deleted

## 2016-09-21 ENCOUNTER — Encounter (INDEPENDENT_AMBULATORY_CARE_PROVIDER_SITE_OTHER): Payer: Self-pay

## 2016-09-21 DIAGNOSIS — I4891 Unspecified atrial fibrillation: Secondary | ICD-10-CM

## 2016-09-21 DIAGNOSIS — I4819 Other persistent atrial fibrillation: Secondary | ICD-10-CM

## 2016-09-21 DIAGNOSIS — I481 Persistent atrial fibrillation: Secondary | ICD-10-CM

## 2016-09-21 DIAGNOSIS — Z5181 Encounter for therapeutic drug level monitoring: Secondary | ICD-10-CM | POA: Diagnosis not present

## 2016-09-21 LAB — POCT INR: INR: 2.2

## 2016-10-19 ENCOUNTER — Ambulatory Visit (INDEPENDENT_AMBULATORY_CARE_PROVIDER_SITE_OTHER): Payer: Medicare Other | Admitting: *Deleted

## 2016-10-19 DIAGNOSIS — I4891 Unspecified atrial fibrillation: Secondary | ICD-10-CM | POA: Diagnosis not present

## 2016-10-19 DIAGNOSIS — Z5181 Encounter for therapeutic drug level monitoring: Secondary | ICD-10-CM

## 2016-10-19 DIAGNOSIS — I481 Persistent atrial fibrillation: Secondary | ICD-10-CM | POA: Diagnosis not present

## 2016-10-19 DIAGNOSIS — I4819 Other persistent atrial fibrillation: Secondary | ICD-10-CM

## 2016-10-19 LAB — POCT INR: INR: 2.3

## 2016-12-02 ENCOUNTER — Ambulatory Visit: Payer: Medicare Other | Admitting: Cardiology

## 2016-12-07 ENCOUNTER — Ambulatory Visit (INDEPENDENT_AMBULATORY_CARE_PROVIDER_SITE_OTHER): Payer: Medicare Other | Admitting: *Deleted

## 2016-12-07 DIAGNOSIS — Z5181 Encounter for therapeutic drug level monitoring: Secondary | ICD-10-CM

## 2016-12-07 DIAGNOSIS — I4891 Unspecified atrial fibrillation: Secondary | ICD-10-CM

## 2016-12-07 DIAGNOSIS — I481 Persistent atrial fibrillation: Secondary | ICD-10-CM

## 2016-12-07 DIAGNOSIS — I4819 Other persistent atrial fibrillation: Secondary | ICD-10-CM

## 2016-12-07 LAB — POCT INR: INR: 3

## 2016-12-27 DIAGNOSIS — D649 Anemia, unspecified: Secondary | ICD-10-CM | POA: Diagnosis not present

## 2016-12-27 DIAGNOSIS — I482 Chronic atrial fibrillation: Secondary | ICD-10-CM | POA: Diagnosis not present

## 2016-12-27 DIAGNOSIS — Z23 Encounter for immunization: Secondary | ICD-10-CM | POA: Diagnosis not present

## 2016-12-27 DIAGNOSIS — E782 Mixed hyperlipidemia: Secondary | ICD-10-CM | POA: Diagnosis not present

## 2016-12-27 DIAGNOSIS — Z0000 Encounter for general adult medical examination without abnormal findings: Secondary | ICD-10-CM | POA: Diagnosis not present

## 2016-12-27 DIAGNOSIS — I10 Essential (primary) hypertension: Secondary | ICD-10-CM | POA: Diagnosis not present

## 2017-01-19 ENCOUNTER — Ambulatory Visit (INDEPENDENT_AMBULATORY_CARE_PROVIDER_SITE_OTHER): Payer: Medicare Other | Admitting: *Deleted

## 2017-01-19 DIAGNOSIS — I4891 Unspecified atrial fibrillation: Secondary | ICD-10-CM | POA: Diagnosis not present

## 2017-01-19 DIAGNOSIS — Z5181 Encounter for therapeutic drug level monitoring: Secondary | ICD-10-CM | POA: Diagnosis not present

## 2017-01-19 DIAGNOSIS — I481 Persistent atrial fibrillation: Secondary | ICD-10-CM

## 2017-01-19 DIAGNOSIS — I4819 Other persistent atrial fibrillation: Secondary | ICD-10-CM

## 2017-01-19 LAB — POCT INR: INR: 3.5

## 2017-01-19 NOTE — Patient Instructions (Signed)
Do not take any Coumadin today then continue same dose of 1/2 tablet on all days except 1 tablet on Thursdays and Sundays.  Recheck INR in 10 days with MD appt.

## 2017-01-30 ENCOUNTER — Ambulatory Visit: Payer: Medicare Other | Admitting: Cardiology

## 2017-02-03 ENCOUNTER — Ambulatory Visit (INDEPENDENT_AMBULATORY_CARE_PROVIDER_SITE_OTHER): Payer: Medicare Other | Admitting: *Deleted

## 2017-02-03 DIAGNOSIS — I4819 Other persistent atrial fibrillation: Secondary | ICD-10-CM

## 2017-02-03 DIAGNOSIS — I4891 Unspecified atrial fibrillation: Secondary | ICD-10-CM | POA: Diagnosis not present

## 2017-02-03 DIAGNOSIS — Z5181 Encounter for therapeutic drug level monitoring: Secondary | ICD-10-CM

## 2017-02-03 DIAGNOSIS — I481 Persistent atrial fibrillation: Secondary | ICD-10-CM

## 2017-02-03 DIAGNOSIS — N183 Chronic kidney disease, stage 3 (moderate): Secondary | ICD-10-CM | POA: Diagnosis not present

## 2017-02-03 LAB — POCT INR: INR: 2.3

## 2017-02-03 NOTE — Patient Instructions (Signed)
Description   Continue same dose of 1/2 tablet on all days except 1 tablet on Thursdays and Sundays.  Recheck INR in 3 weeks but pt has no transportation so will not be able to come back . until January 8th

## 2017-02-28 ENCOUNTER — Ambulatory Visit (INDEPENDENT_AMBULATORY_CARE_PROVIDER_SITE_OTHER): Payer: Medicare Other | Admitting: *Deleted

## 2017-02-28 DIAGNOSIS — Z5181 Encounter for therapeutic drug level monitoring: Secondary | ICD-10-CM

## 2017-02-28 DIAGNOSIS — I481 Persistent atrial fibrillation: Secondary | ICD-10-CM | POA: Diagnosis not present

## 2017-02-28 DIAGNOSIS — I4819 Other persistent atrial fibrillation: Secondary | ICD-10-CM

## 2017-02-28 DIAGNOSIS — I4891 Unspecified atrial fibrillation: Secondary | ICD-10-CM | POA: Diagnosis not present

## 2017-02-28 LAB — POCT INR: INR: 3.3

## 2017-02-28 NOTE — Patient Instructions (Addendum)
Description   Do not take today's dose then continue same dose of 1/2 tablet on all days except 1 tablet on Thursdays and Sundays.  Recheck INR in 3 weeks. Resume 1-2 leafy veggies weekly.

## 2017-03-21 ENCOUNTER — Ambulatory Visit (INDEPENDENT_AMBULATORY_CARE_PROVIDER_SITE_OTHER): Payer: Medicare Other | Admitting: *Deleted

## 2017-03-21 DIAGNOSIS — I481 Persistent atrial fibrillation: Secondary | ICD-10-CM

## 2017-03-21 DIAGNOSIS — I4819 Other persistent atrial fibrillation: Secondary | ICD-10-CM

## 2017-03-21 DIAGNOSIS — Z5181 Encounter for therapeutic drug level monitoring: Secondary | ICD-10-CM | POA: Diagnosis not present

## 2017-03-21 DIAGNOSIS — I4891 Unspecified atrial fibrillation: Secondary | ICD-10-CM | POA: Diagnosis not present

## 2017-03-21 LAB — POCT INR: INR: 2.5

## 2017-03-21 NOTE — Patient Instructions (Signed)
Description   Continue same dose of 1/2 tablet on all days except 1 tablet on Thursdays and Sundays.  Recheck INR in 3 weeks. Resume 1-2 leafy veggies weekly.

## 2017-04-09 ENCOUNTER — Encounter: Payer: Self-pay | Admitting: Cardiology

## 2017-04-09 NOTE — Progress Notes (Signed)
Cardiology Office Note:    Date:  04/10/2017   ID:  James Strong, DOB [birth year], MRN [id number]  PCP:  Wenda Low, MD  Cardiologist:  No primary care provider on file.    Referring MD: Wenda Low, MD   Chief Complaint  Patient presents with  . Coronary Artery Disease  . Atrial Fibrillation  . Hypertension  . Hyperlipidemia    History of Present Illness:    James Strong is a 84 y.o. male with a hx of nonobstructive ASCAD, dyslipidemia, PAF, HTN and chronic anticoagulation.  He is here today for followup and is doing well.  He denies any chest pain or pressure, SOB, DOE, PND, orthopnea, LE edema, dizziness, palpitations or syncope. He is compliant with his meds and is tolerating meds with no SE.    Past Medical History:  Diagnosis Date  . Anemia   . Benign hypertensive heart disease without heart failure   . Chronic anticoagulation   . CKD (chronic kidney disease)   . Colon polyp   . Coronary atherosclerosis of native coronary artery    nonobstructive ASCAD - 30-40% mid LAD and 30% mid PL by cath 2006  . Hyperlipidemia   . Iron deficiency   . Melanoma of skin, site unspecified   . Osteoarthrosis   . PAF (paroxysmal atrial fibrillation) ([facility])    on warfarin for CHADS2VASC score of 4  . Prediabetes     Past Surgical History:  Procedure Laterality Date  . MELANOMA EXCISION     superficial on scalp  . skin melonoma removed from scalp      Current Medications: Current Meds  Medication Sig  . diltiazem (TIAZAC) 120 MG 24 hr capsule Take 120 mg by mouth daily.  . metoprolol (LOPRESSOR) 50 MG tablet Take 50 mg by mouth 2 (two) times daily.  . mirtazapine (REMERON) 15 MG tablet Take 15 mg by mouth at bedtime.  . simvastatin (ZOCOR) 20 MG tablet Take 10 mg by mouth daily. Take 1/2 tablet by mouth daily  . warfarin (COUMADIN) 5 MG tablet Take 5 mg by mouth daily. As directed     Allergies:   Patient has no known allergies.   Social History    Socioeconomic History  . Marital status: Single    Spouse name: None  . Number of children: None  . Years of education: None  . Highest education level: None  Social Needs  . Financial resource strain: None  . Food insecurity - worry: None  . Food insecurity - inability: None  . Transportation needs - medical: None  . Transportation needs - non-medical: None  Occupational History  . None  Tobacco Use  . Smoking status: Former Smoker    Last attempt to quit: 12/12/1969    Years since quitting: 47.3  . Smokeless tobacco: Never Used  Substance and Sexual Activity  . Alcohol use: Yes    Comment: occasionally  . Drug use: None  . Sexual activity: None  Other Topics Concern  . None  Social History Narrative  . None     Family History: The patient's family history includes Dementia in his father.  ROS:   Please see the history of present illness.    ROS  All other systems reviewed and negative.   EKGs/Labs/Other Studies Reviewed:    The following studies were reviewed today: none  EKG:  EKG is  ordered today.  The ekg ordered today demonstrates NSR at 97bpm with RBBB  Recent Labs: No  results found for requested labs within last 8760 hours.   Recent Lipid Panel    Component Value Date/Time   CHOL 104 06/03/2014 0847   TRIG 95.0 06/03/2014 0847   HDL 28.70 (L) 06/03/2014 0847   CHOLHDL 4 06/03/2014 0847   VLDL 19.0 06/03/2014 0847   LDLCALC 56 06/03/2014 0847    Physical Exam:    VS:  BP 134/78   Pulse 97   Ht 5\' 9"  (1.753 m)   Wt 140 lb (63.5 kg)   BMI 20.67 kg/m     Wt Readings from Last 3 Encounters:  04/10/17 140 lb (63.5 kg)  12/18/15 143 lb 6.4 oz (65 kg)  12/10/14 151 lb (68.5 kg)     GEN:  Well nourished, well developed in no acute distress HEENT: Normal NECK: No JVD; No carotid bruits LYMPHATICS: No lymphadenopathy CARDIAC: RRR, no murmurs, rubs, gallops RESPIRATORY:  Clear to auscultation without rales, wheezing or rhonchi  ABDOMEN:  Soft, non-tender, non-distended MUSCULOSKELETAL:  Trace LLE edema; No deformity  SKIN: Warm and dry NEUROLOGIC:  Alert and oriented x 3 PSYCHIATRIC:  Normal affect   ASSESSMENT:    1. Atherosclerosis of native coronary artery of native heart without angina pectoris   2. Essential hypertension   3. Persistent atrial fibrillation ([place])   4. Pure hypercholesterolemia    PLAN:    In order of problems listed above:  1.  ASCAD - nonobstructive ASCAD - 30-40% mid LAD and 30% mid PL by cath 2006.  He denies any anginal symptoms.  He will continue on BB and statin. No ASA due to warfarin  2.  HTN - BP is well controlled on exam today.  He will continue on Diltiazem 120mg  daily and Lopressor 50mg  BID.  3.  Persistent atrial fibrillation - he is maintaining NSR.  He will continue on CCB and BB for suppression.  He is on warfarin for a CHADS2VASC score of 4.  He denies any problems with bleeding. I will check a CBC.   He is interested in changing to a DOAC which I would be fine with.  Eliquis 2.5mg  BID would probably be the best option.  He will discuss this in Coumadin clinic.   4.  Hyperlipidemia with LDL goal < 70.  He will continue on simvastatin 20mg  daily. Huis last LDL at 32 in Nov 2018.   Medication Adjustments/Labs and Tests Ordered: Current medicines are reviewed at length with the patient today.  Concerns regarding medicines are outlined above.  No orders of the defined types were placed in this encounter.  No orders of the defined types were placed in this encounter.   Signed, Fransico Him, MD  04/10/2017 1:04 PM    [place]

## 2017-04-10 ENCOUNTER — Ambulatory Visit: Payer: Medicare Other | Admitting: Cardiology

## 2017-04-10 ENCOUNTER — Encounter: Payer: Self-pay | Admitting: Cardiology

## 2017-04-10 ENCOUNTER — Ambulatory Visit (INDEPENDENT_AMBULATORY_CARE_PROVIDER_SITE_OTHER): Payer: Medicare Other

## 2017-04-10 VITALS — BP 134/78 | HR 97 | Ht 69.0 in | Wt 140.0 lb

## 2017-04-10 DIAGNOSIS — Z5181 Encounter for therapeutic drug level monitoring: Secondary | ICD-10-CM | POA: Diagnosis not present

## 2017-04-10 DIAGNOSIS — I4819 Other persistent atrial fibrillation: Secondary | ICD-10-CM

## 2017-04-10 DIAGNOSIS — I481 Persistent atrial fibrillation: Secondary | ICD-10-CM | POA: Diagnosis not present

## 2017-04-10 DIAGNOSIS — E7800 Pure hypercholesterolemia, unspecified: Secondary | ICD-10-CM

## 2017-04-10 DIAGNOSIS — I4891 Unspecified atrial fibrillation: Secondary | ICD-10-CM

## 2017-04-10 DIAGNOSIS — I2510 Atherosclerotic heart disease of native coronary artery without angina pectoris: Secondary | ICD-10-CM

## 2017-04-10 DIAGNOSIS — I10 Essential (primary) hypertension: Secondary | ICD-10-CM | POA: Diagnosis not present

## 2017-04-10 LAB — POCT INR: INR: 3.4

## 2017-04-10 NOTE — Patient Instructions (Signed)
Medication Instructions:  Your physician recommends that you continue on your current medications as directed. Please refer to the Current Medication list given to you today.  Labwork: Today for complete blood count and liver function test   Testing/Procedures: None ordered   Follow-Up: Your physician wants you to follow-up in: 1 year with Dr. Radford Pax. You will receive a reminder letter in the mail two months in advance. If you don't receive a letter, please call our office to schedule the follow-up appointment.  Any Other Special Instructions Will Be Listed Below (If Applicable).  Thank you for choosing [facility], RN  [phone]   If you need a refill on your cardiac medications before your next appointment, please call your pharmacy.

## 2017-04-10 NOTE — Addendum Note (Signed)
Addended by: Teressa Senter on: 04/10/2017 01:40 PM   Modules accepted: Orders

## 2017-04-10 NOTE — Patient Instructions (Signed)
Description   Hold today's dosage of Coumadin, then start taking 1/2 tablet on all days except 1 tablet on Thursdays.  Recheck INR in 3 weeks.

## 2017-04-11 LAB — BASIC METABOLIC PANEL
BUN/Creatinine Ratio: 14 (ref 10–24)
BUN: 23 mg/dL (ref 10–36)
CO2: 19 mmol/L — ABNORMAL LOW (ref 20–29)
Calcium: 8.6 mg/dL (ref 8.6–10.2)
Chloride: 109 mmol/L — ABNORMAL HIGH (ref 96–106)
Creatinine, Ser: 1.6 mg/dL — ABNORMAL HIGH (ref 0.76–1.27)
GFR, EST AFRICAN AMERICAN: 43 mL/min/{1.73_m2} — AB (ref >59)
GFR, EST NON AFRICAN AMERICAN: 37 mL/min/{1.73_m2} — AB (ref >59)
Glucose: 94 mg/dL (ref 65–99)
Potassium: 5.1 mmol/L (ref 3.5–5.2)
SODIUM: 142 mmol/L (ref 134–144)

## 2017-04-11 LAB — HEPATIC FUNCTION PANEL
ALBUMIN: 4 g/dL (ref 3.2–4.6)
ALK PHOS: 101 IU/L (ref 39–117)
ALT: 10 IU/L (ref 0–44)
AST: 17 IU/L (ref 0–40)
BILIRUBIN TOTAL: 0.5 mg/dL (ref 0.0–1.2)
Bilirubin, Direct: 0.21 mg/dL (ref 0.00–0.40)
Total Protein: 7.8 g/dL (ref 6.0–8.5)

## 2017-04-11 LAB — CBC
HEMATOCRIT: 34.8 % — AB (ref 37.5–51.0)
HEMOGLOBIN: 11.3 g/dL — AB (ref 13.0–17.7)
MCH: 31.8 pg (ref 26.6–33.0)
MCHC: 32.5 g/dL (ref 31.5–35.7)
MCV: 98 fL — ABNORMAL HIGH (ref 79–97)
Platelets: 191 10*3/uL (ref 150–379)
RBC: 3.55 x10E6/uL — ABNORMAL LOW (ref 4.14–5.80)
RDW: 14.8 % (ref 12.3–15.4)
WBC: 4.4 10*3/uL (ref 3.4–10.8)

## 2017-05-01 ENCOUNTER — Ambulatory Visit (INDEPENDENT_AMBULATORY_CARE_PROVIDER_SITE_OTHER): Payer: Medicare Other | Admitting: *Deleted

## 2017-05-01 DIAGNOSIS — Z5181 Encounter for therapeutic drug level monitoring: Secondary | ICD-10-CM

## 2017-05-01 DIAGNOSIS — I481 Persistent atrial fibrillation: Secondary | ICD-10-CM | POA: Diagnosis not present

## 2017-05-01 DIAGNOSIS — I4819 Other persistent atrial fibrillation: Secondary | ICD-10-CM

## 2017-05-01 DIAGNOSIS — I4891 Unspecified atrial fibrillation: Secondary | ICD-10-CM

## 2017-05-01 LAB — POCT INR: INR: 1.8

## 2017-05-01 NOTE — Patient Instructions (Addendum)
Description   Today take 1 tablet then continue taking 1/2 tablet on all days except 1 tablet on Thursdays.  Recheck INR in 3 weeks. Coumadin Clinic [phone]

## 2017-05-25 ENCOUNTER — Ambulatory Visit (INDEPENDENT_AMBULATORY_CARE_PROVIDER_SITE_OTHER): Payer: Medicare Other | Admitting: *Deleted

## 2017-05-25 DIAGNOSIS — Z5181 Encounter for therapeutic drug level monitoring: Secondary | ICD-10-CM | POA: Diagnosis not present

## 2017-05-25 DIAGNOSIS — I4891 Unspecified atrial fibrillation: Secondary | ICD-10-CM

## 2017-05-25 DIAGNOSIS — I481 Persistent atrial fibrillation: Secondary | ICD-10-CM

## 2017-05-25 DIAGNOSIS — I4819 Other persistent atrial fibrillation: Secondary | ICD-10-CM

## 2017-05-25 LAB — POCT INR: INR: 3.6

## 2017-05-25 NOTE — Patient Instructions (Signed)
Description   Hold today's dose then continue taking 1/2 tablet on all days except 1 tablet on Thursdays.  Recheck INR in 3 weeks. Coumadin Clinic [phone]

## 2017-06-15 ENCOUNTER — Ambulatory Visit (INDEPENDENT_AMBULATORY_CARE_PROVIDER_SITE_OTHER): Payer: Medicare Other | Admitting: *Deleted

## 2017-06-15 DIAGNOSIS — I4891 Unspecified atrial fibrillation: Secondary | ICD-10-CM | POA: Diagnosis not present

## 2017-06-15 DIAGNOSIS — Z5181 Encounter for therapeutic drug level monitoring: Secondary | ICD-10-CM

## 2017-06-15 DIAGNOSIS — I481 Persistent atrial fibrillation: Secondary | ICD-10-CM | POA: Diagnosis not present

## 2017-06-15 DIAGNOSIS — I4819 Other persistent atrial fibrillation: Secondary | ICD-10-CM

## 2017-06-15 LAB — POCT INR: INR: 2.8

## 2017-06-15 NOTE — Patient Instructions (Addendum)
Description   Change your dose to 1/2 tablet everyday. Continue to drink your daily breakfast shake. Eat your 1 serving each of leafy green vegetable.   Recheck INR in 2 weeks. Coumadin Clinic [phone]

## 2017-06-27 DIAGNOSIS — E782 Mixed hyperlipidemia: Secondary | ICD-10-CM | POA: Diagnosis not present

## 2017-06-27 DIAGNOSIS — N183 Chronic kidney disease, stage 3 (moderate): Secondary | ICD-10-CM | POA: Diagnosis not present

## 2017-06-27 DIAGNOSIS — I10 Essential (primary) hypertension: Secondary | ICD-10-CM | POA: Diagnosis not present

## 2017-06-27 DIAGNOSIS — I482 Chronic atrial fibrillation: Secondary | ICD-10-CM | POA: Diagnosis not present

## 2017-06-29 ENCOUNTER — Ambulatory Visit (INDEPENDENT_AMBULATORY_CARE_PROVIDER_SITE_OTHER): Payer: Medicare Other | Admitting: *Deleted

## 2017-06-29 DIAGNOSIS — Z5181 Encounter for therapeutic drug level monitoring: Secondary | ICD-10-CM

## 2017-06-29 DIAGNOSIS — I4891 Unspecified atrial fibrillation: Secondary | ICD-10-CM

## 2017-06-29 DIAGNOSIS — I481 Persistent atrial fibrillation: Secondary | ICD-10-CM | POA: Diagnosis not present

## 2017-06-29 DIAGNOSIS — I4819 Other persistent atrial fibrillation: Secondary | ICD-10-CM

## 2017-06-29 LAB — POCT INR: INR: 2

## 2017-06-29 NOTE — Patient Instructions (Signed)
Description   Continue same dose of comadin 1/2 tablet everyday. Continue to drink your daily breakfast shake. Eat your 1 serving each of leafy green vegetable.   Recheck INR in 3 weeks. Coumadin Clinic [phone]

## 2017-07-20 ENCOUNTER — Ambulatory Visit (INDEPENDENT_AMBULATORY_CARE_PROVIDER_SITE_OTHER): Payer: Medicare Other | Admitting: Pharmacist

## 2017-07-20 DIAGNOSIS — Z5181 Encounter for therapeutic drug level monitoring: Secondary | ICD-10-CM | POA: Diagnosis not present

## 2017-07-20 DIAGNOSIS — I4891 Unspecified atrial fibrillation: Secondary | ICD-10-CM

## 2017-07-20 DIAGNOSIS — I481 Persistent atrial fibrillation: Secondary | ICD-10-CM

## 2017-07-20 DIAGNOSIS — I4819 Other persistent atrial fibrillation: Secondary | ICD-10-CM

## 2017-07-20 LAB — POCT INR: INR: 2.1 (ref 2.0–3.0)

## 2017-07-20 NOTE — Patient Instructions (Signed)
Description   Continue same dose of comadin 1/2 tablet everyday. Continue to drink your daily breakfast shake. Eat your 1 serving each of leafy green vegetable.   Recheck INR in 4 weeks. [facility]madin Clinic [phone]

## 2017-08-07 DIAGNOSIS — I482 Chronic atrial fibrillation: Secondary | ICD-10-CM | POA: Diagnosis not present

## 2017-08-17 ENCOUNTER — Ambulatory Visit (INDEPENDENT_AMBULATORY_CARE_PROVIDER_SITE_OTHER): Payer: Medicare Other

## 2017-08-17 DIAGNOSIS — Z5181 Encounter for therapeutic drug level monitoring: Secondary | ICD-10-CM

## 2017-08-17 DIAGNOSIS — I4891 Unspecified atrial fibrillation: Secondary | ICD-10-CM

## 2017-08-17 DIAGNOSIS — I4819 Other persistent atrial fibrillation: Secondary | ICD-10-CM

## 2017-08-17 DIAGNOSIS — I481 Persistent atrial fibrillation: Secondary | ICD-10-CM | POA: Diagnosis not present

## 2017-08-17 LAB — POCT INR: INR: 1.7 — AB (ref 2.0–3.0)

## 2017-08-17 NOTE — Patient Instructions (Signed)
Description   Take 1 tablet today, then resume same dose of comadin 1/2 tablet everyday.  Recheck INR in 4 weeks. Coumadin Clinic [phone]

## 2017-09-07 ENCOUNTER — Ambulatory Visit (INDEPENDENT_AMBULATORY_CARE_PROVIDER_SITE_OTHER): Payer: Medicare Other | Admitting: Pharmacist

## 2017-09-07 ENCOUNTER — Encounter (INDEPENDENT_AMBULATORY_CARE_PROVIDER_SITE_OTHER): Payer: Self-pay

## 2017-09-07 DIAGNOSIS — I481 Persistent atrial fibrillation: Secondary | ICD-10-CM

## 2017-09-07 DIAGNOSIS — Z5181 Encounter for therapeutic drug level monitoring: Secondary | ICD-10-CM | POA: Diagnosis not present

## 2017-09-07 DIAGNOSIS — I4891 Unspecified atrial fibrillation: Secondary | ICD-10-CM | POA: Diagnosis not present

## 2017-09-07 DIAGNOSIS — I4819 Other persistent atrial fibrillation: Secondary | ICD-10-CM

## 2017-09-07 LAB — POCT INR: INR: 1.9 — AB (ref 2.0–3.0)

## 2017-09-07 NOTE — Patient Instructions (Signed)
Description   Change dose to comadin 1/2 tablet everyday, except 1 tablet on Thursdays.  Recheck INR in 3 weeks. Coumadin Clinic [phone]

## 2017-09-28 ENCOUNTER — Ambulatory Visit (INDEPENDENT_AMBULATORY_CARE_PROVIDER_SITE_OTHER): Payer: Medicare Other | Admitting: *Deleted

## 2017-09-28 DIAGNOSIS — N183 Chronic kidney disease, stage 3 (moderate): Secondary | ICD-10-CM | POA: Diagnosis not present

## 2017-09-28 DIAGNOSIS — I482 Chronic atrial fibrillation: Secondary | ICD-10-CM | POA: Diagnosis not present

## 2017-09-28 DIAGNOSIS — C439 Malignant melanoma of skin, unspecified: Secondary | ICD-10-CM | POA: Diagnosis not present

## 2017-09-28 DIAGNOSIS — I4819 Other persistent atrial fibrillation: Secondary | ICD-10-CM

## 2017-09-28 DIAGNOSIS — Z5181 Encounter for therapeutic drug level monitoring: Secondary | ICD-10-CM

## 2017-09-28 DIAGNOSIS — I481 Persistent atrial fibrillation: Secondary | ICD-10-CM

## 2017-09-28 DIAGNOSIS — I10 Essential (primary) hypertension: Secondary | ICD-10-CM | POA: Diagnosis not present

## 2017-09-28 DIAGNOSIS — I4891 Unspecified atrial fibrillation: Secondary | ICD-10-CM | POA: Diagnosis not present

## 2017-09-28 LAB — POCT INR: INR: 2.1 (ref 2.0–3.0)

## 2017-09-28 NOTE — Patient Instructions (Signed)
Description   Continue taking Coumadin 1/2 tablet everyday, except 1 tablet on Thursdays.  Recheck INR in 4 weeks. Coumadin Clinic [phone]

## 2017-10-26 ENCOUNTER — Ambulatory Visit (INDEPENDENT_AMBULATORY_CARE_PROVIDER_SITE_OTHER): Payer: Medicare Other | Admitting: *Deleted

## 2017-10-26 DIAGNOSIS — I481 Persistent atrial fibrillation: Secondary | ICD-10-CM

## 2017-10-26 DIAGNOSIS — I4819 Other persistent atrial fibrillation: Secondary | ICD-10-CM

## 2017-10-26 DIAGNOSIS — Z5181 Encounter for therapeutic drug level monitoring: Secondary | ICD-10-CM | POA: Diagnosis not present

## 2017-10-26 DIAGNOSIS — I4891 Unspecified atrial fibrillation: Secondary | ICD-10-CM | POA: Diagnosis not present

## 2017-10-26 LAB — POCT INR: INR: 2.5 (ref 2.0–3.0)

## 2017-10-26 NOTE — Patient Instructions (Addendum)
Description   Continue taking Coumadin 1/2 tablet everyday, except 1 tablet on Thursdays.  Recheck INR in 5 weeks. Coumadin Clinic [phone]

## 2017-11-30 ENCOUNTER — Ambulatory Visit: Payer: Medicare Other | Admitting: *Deleted

## 2017-11-30 DIAGNOSIS — I4891 Unspecified atrial fibrillation: Secondary | ICD-10-CM

## 2017-11-30 DIAGNOSIS — Z23 Encounter for immunization: Secondary | ICD-10-CM | POA: Diagnosis not present

## 2017-11-30 DIAGNOSIS — Z5181 Encounter for therapeutic drug level monitoring: Secondary | ICD-10-CM

## 2017-11-30 DIAGNOSIS — I4819 Other persistent atrial fibrillation: Secondary | ICD-10-CM

## 2017-11-30 LAB — POCT INR: INR: 1.7 — AB (ref 2.0–3.0)

## 2017-11-30 NOTE — Patient Instructions (Signed)
Description   Today take 1.5 tablets then continue taking Coumadin 1/2 tablet everyday, except 1 tablet on Thursdays.  Recheck INR in 3 weeks. [facility] [phone]

## 2017-12-21 ENCOUNTER — Ambulatory Visit: Payer: Medicare Other | Admitting: *Deleted

## 2017-12-21 DIAGNOSIS — I4891 Unspecified atrial fibrillation: Secondary | ICD-10-CM

## 2017-12-21 DIAGNOSIS — Z5181 Encounter for therapeutic drug level monitoring: Secondary | ICD-10-CM

## 2017-12-21 DIAGNOSIS — I4819 Other persistent atrial fibrillation: Secondary | ICD-10-CM

## 2017-12-21 LAB — POCT INR: INR: 2.3 (ref 2.0–3.0)

## 2017-12-21 NOTE — Patient Instructions (Signed)
Description   Continue taking Coumadin 1/2 tablet everyday, except 1 tablet on Thursdays.  Recheck INR in 4 weeks. [facility] [phone]

## 2017-12-23 DIAGNOSIS — S42031A Displaced fracture of lateral end of right clavicle, initial encounter for closed fracture: Secondary | ICD-10-CM | POA: Diagnosis not present

## 2017-12-23 DIAGNOSIS — Z7901 Long term (current) use of anticoagulants: Secondary | ICD-10-CM | POA: Diagnosis not present

## 2017-12-23 DIAGNOSIS — I4891 Unspecified atrial fibrillation: Secondary | ICD-10-CM | POA: Diagnosis not present

## 2017-12-23 DIAGNOSIS — S0990XA Unspecified injury of head, initial encounter: Secondary | ICD-10-CM | POA: Diagnosis not present

## 2017-12-23 DIAGNOSIS — S199XXA Unspecified injury of neck, initial encounter: Secondary | ICD-10-CM | POA: Diagnosis not present

## 2017-12-23 DIAGNOSIS — R9089 Other abnormal findings on diagnostic imaging of central nervous system: Secondary | ICD-10-CM | POA: Diagnosis not present

## 2017-12-29 ENCOUNTER — Telehealth: Payer: Self-pay | Admitting: Cardiology

## 2017-12-29 DIAGNOSIS — E782 Mixed hyperlipidemia: Secondary | ICD-10-CM | POA: Diagnosis not present

## 2017-12-29 DIAGNOSIS — Z1389 Encounter for screening for other disorder: Secondary | ICD-10-CM | POA: Diagnosis not present

## 2017-12-29 DIAGNOSIS — C439 Malignant melanoma of skin, unspecified: Secondary | ICD-10-CM | POA: Diagnosis not present

## 2017-12-29 DIAGNOSIS — E46 Unspecified protein-calorie malnutrition: Secondary | ICD-10-CM | POA: Diagnosis not present

## 2017-12-29 NOTE — Telephone Encounter (Signed)
Noted - upcoming Coumadin check has been canceled and medication list has been updated.

## 2017-12-29 NOTE — Telephone Encounter (Signed)
Called by Dr. Deforest Hoyles who wants to stop patient's coumadin.  Had a bad fall sustaining multiple injuries with ongoing bleeding from wounds.  Has had multiple falls recently.  Please cancel further coumadin OV

## 2017-12-29 NOTE — Telephone Encounter (Signed)
New Message    [name] with Dr. Pennelope Bracken office at [facility] is requesting a callback from Dr. Radford Pax. She states that the provider did not provide a reason for the call only that the doctor wants to speak with Dr. Radford Pax. Number provided is directly to the doctor.

## 2017-12-31 DIAGNOSIS — S51811D Laceration without foreign body of right forearm, subsequent encounter: Secondary | ICD-10-CM | POA: Diagnosis not present

## 2017-12-31 DIAGNOSIS — S0101XD Laceration without foreign body of scalp, subsequent encounter: Secondary | ICD-10-CM | POA: Diagnosis not present

## 2017-12-31 DIAGNOSIS — I129 Hypertensive chronic kidney disease with stage 1 through stage 4 chronic kidney disease, or unspecified chronic kidney disease: Secondary | ICD-10-CM | POA: Diagnosis not present

## 2017-12-31 DIAGNOSIS — S41011D Laceration without foreign body of right shoulder, subsequent encounter: Secondary | ICD-10-CM | POA: Diagnosis not present

## 2017-12-31 DIAGNOSIS — S42001D Fracture of unspecified part of right clavicle, subsequent encounter for fracture with routine healing: Secondary | ICD-10-CM | POA: Diagnosis not present

## 2018-01-01 DIAGNOSIS — S42024A Nondisplaced fracture of shaft of right clavicle, initial encounter for closed fracture: Secondary | ICD-10-CM | POA: Diagnosis not present

## 2018-01-08 DIAGNOSIS — S42024D Nondisplaced fracture of shaft of right clavicle, subsequent encounter for fracture with routine healing: Secondary | ICD-10-CM | POA: Diagnosis not present

## 2018-01-29 DIAGNOSIS — E875 Hyperkalemia: Secondary | ICD-10-CM | POA: Diagnosis not present

## 2018-01-29 DIAGNOSIS — S42024D Nondisplaced fracture of shaft of right clavicle, subsequent encounter for fracture with routine healing: Secondary | ICD-10-CM | POA: Diagnosis not present

## 2018-01-30 DIAGNOSIS — R350 Frequency of micturition: Secondary | ICD-10-CM | POA: Diagnosis not present

## 2018-01-30 DIAGNOSIS — I10 Essential (primary) hypertension: Secondary | ICD-10-CM | POA: Diagnosis not present

## 2018-01-30 DIAGNOSIS — E46 Unspecified protein-calorie malnutrition: Secondary | ICD-10-CM | POA: Diagnosis not present

## 2018-04-10 ENCOUNTER — Encounter: Payer: Self-pay | Admitting: Physician Assistant

## 2018-04-10 ENCOUNTER — Encounter (INDEPENDENT_AMBULATORY_CARE_PROVIDER_SITE_OTHER): Payer: Self-pay

## 2018-04-10 ENCOUNTER — Ambulatory Visit: Payer: Medicare Other | Admitting: Physician Assistant

## 2018-04-10 VITALS — BP 120/50 | HR 90 | Ht 69.0 in | Wt 129.8 lb

## 2018-04-10 DIAGNOSIS — I10 Essential (primary) hypertension: Secondary | ICD-10-CM

## 2018-04-10 DIAGNOSIS — N183 Chronic kidney disease, stage 3 unspecified: Secondary | ICD-10-CM

## 2018-04-10 DIAGNOSIS — I2510 Atherosclerotic heart disease of native coronary artery without angina pectoris: Secondary | ICD-10-CM

## 2018-04-10 DIAGNOSIS — I480 Paroxysmal atrial fibrillation: Secondary | ICD-10-CM | POA: Diagnosis not present

## 2018-04-10 NOTE — Patient Instructions (Addendum)
Medication Instructions:  No changes.  See your medication list.  If you need a refill on your cardiac medications before your next appointment, please call your pharmacy.   Lab work: None   If you have labs (blood work) drawn today and your tests are completely normal, you will receive your results only by: Marland Kitchen MyChart Message (if you have MyChart) OR . A paper copy in the mail If you have any lab test that is abnormal or we need to change your treatment, we will call you to review the results.  Testing/Procedures: None   Follow-Up: At [facility], you and your health needs are our priority.  As part of our continuing mission to provide you with exceptional heart care, we have created designated Provider Care Teams.  These Care Teams include your primary Cardiologist (physician) and Advanced Practice Providers (APPs -  Physician Assistants and Nurse Practitioners) who all work together to provide you with the care you need, when you need it. You will need a follow up appointment in 12 months.  Please call our office 2 months in advance to schedule this appointment.  You may see Fransico Him, MD or one of the following Advanced Practice Providers on your designated Care Team:   Orwell, PA-C Melina Copa, PA-C . Ermalinda Barrios, PA-C  Any Other Special Instructions Will Be Listed Below (If Applicable).

## 2018-04-10 NOTE — Progress Notes (Signed)
Cardiology Office Note:    Date:  04/10/2018   ID:  James Strong, DOB [birth year], MRN [id number]  PCP:  James Low, MD  Cardiologist:  James Him, MD   Electrophysiologist:  None   Referring MD: James Low, MD   Chief Complaint  Patient presents with  . Follow-up    CAD, AFib      History of Present Illness:    James Strong is a 85 y.o. male with nonobstructive coronary artery disease, hyperlipidemia, paroxysmal atrial fibrillation, hypertension.  He was last seen by Dr. Radford Strong in February 2019.   James Strong returns for follow up on CAD, atrial fibrillation.   He is here with his daughter.  Since last seen, he had a severe fall and with several fractures.  His PCP conferred with Dr. Radford Strong and the decision was made to stop his anticoagulation. He is no longer on Coumadin.  His injuries have since healed.  He is not having any chest pain, shortness of breath, syncope, paroxysmal nocturnal dyspnea, leg swelling.    Prior CV studies:   The following studies were reviewed today:  Myoview 12/31/2012 Overall Impression:  Strong risk stress nuclear study fixed defect in the inferoapical segments consistent with diaphragmatic attenuation and gut uptake. LV Ejection Fraction: 49%.  LV Wall Motion:  NL LV Function; NL Wall Motion  Cardiac Catheterization 09/30/2004 LM ok LAD mid 30-40 LCx ok RCA irregs; PLB mid 30 EF 48  Past Medical History:  Diagnosis Date  . Anemia   . Benign hypertensive heart disease without heart failure   . Chronic anticoagulation   . CKD (chronic kidney disease)   . Colon polyp   . Coronary atherosclerosis of native coronary artery    nonobstructive ASCAD - 30-40% mid LAD and 30% mid PL by cath 2006  . Hyperlipidemia   . Iron deficiency   . Melanoma of skin, site unspecified   . Osteoarthrosis   . PAF (paroxysmal atrial fibrillation) (HCC)    on warfarin for CHADS2VASC score of 4  . Prediabetes    Surgical Hx: The patient  has a past  surgical history that includes skin melonoma removed from scalp and Melanoma excision.   Current Medications: Current Meds  Medication Sig  . diltiazem (TIAZAC) 120 MG 24 hr capsule Take 120 mg by mouth daily.  . metoprolol (LOPRESSOR) 50 MG tablet Take 50 mg by mouth 2 (two) times daily.  . mirtazapine (REMERON) 15 MG tablet Take 15 mg by mouth at bedtime.  . simvastatin (ZOCOR) 20 MG tablet Take 10 mg by mouth daily. Take 1/2 tablet by mouth daily     Allergies:   Patient has no known allergies.   Social History   Tobacco Use  . Smoking status: Former Smoker    Last attempt to quit: 12/12/1969    Years since quitting: 48.3  . Smokeless tobacco: Never Used  Substance Use Topics  . Alcohol use: Yes    Comment: occasionally  . Drug use: Not on file     Family Hx: The patient's family history includes Dementia in his father.  ROS:   Please see the history of present illness.    ROS All other systems reviewed and are negative.   EKGs/Labs/Other Test Reviewed:    EKG:  EKG is  ordered today.  The ekg ordered today demonstrates probable normal sinus rhythm, HR 91, LAD, RBBB, QTc 514, no changes   Recent Labs: No results found for requested labs within last  8760 hours.   Recent Lipid Panel Lab Results  Component Value Date/Time   CHOL 104 06/03/2014 08:47 AM   TRIG 95.0 06/03/2014 08:47 AM   HDL 28.70 (L) 06/03/2014 08:47 AM   CHOLHDL 4 06/03/2014 08:47 AM   LDLCALC 56 06/03/2014 08:47 AM   From KPN Tool:    Physical Exam:    VS:  BP (!) 120/50   Pulse 90   Ht 5\' 9"  (1.753 m)   Wt 129 lb 12.8 oz (58.9 kg)   SpO2 97%   BMI 19.17 kg/m     Wt Readings from Last 3 Encounters:  04/10/18 129 lb 12.8 oz (58.9 kg)  04/10/17 140 lb (63.5 kg)  12/18/15 143 lb 6.4 oz (65 kg)     Physical Exam  Constitutional: He is oriented to person, place, and time. He appears well-developed and well-nourished. No distress.  HENT:  Head: Normocephalic and atraumatic.  Eyes:  No scleral icterus.  Neck: No JVD present. No thyromegaly present.  Cardiovascular: Normal rate and regular rhythm.  No murmur heard. Pulmonary/Chest: Effort normal. He has no rales.  Abdominal: Soft. He exhibits no distension.  Musculoskeletal:        General: Edema (trace-1+ bilat LE edema) present.  Lymphadenopathy:    He has no cervical adenopathy.  Neurological: He is alert and oriented to person, place, and time.  Skin: Skin is warm and dry.  Psychiatric: He has a normal mood and affect.    ASSESSMENT & PLAN:    Coronary artery disease involving native coronary artery of native heart without angina pectoris Non-obstructive by Cardiac Catheterization in 2006.  Myoview in 2014 Strong risk.  He denies angina.  Continue statin.  PAF (paroxysmal atrial fibrillation) (HCC)  Maintaining normal sinus rhythm.  He is no longer a candidate for anticoagulation.   Stage 3 chronic kidney disease (James) Most recent Creatinine 1.85.  Essential hypertension  The patient's blood pressure is controlled on his current regimen.  Continue current therapy.     Dispo:  Return in about 1 year (around 04/11/2019) for Routine Follow Up w/ Dr. Radford Strong, or PA/NP.   Medication Adjustments/Labs and Tests Ordered: Current medicines are reviewed at length with the patient today.  Concerns regarding medicines are outlined above.  Tests Ordered: Orders Placed This Encounter  Procedures  . EKG 12-Lead   Medication Changes: No orders of the defined types were placed in this encounter.   Signed, James Dopp, PA-C  04/10/2018 4:46 PM    [facility] [place], [place], [place]  [address] Phone: [phone]; Fax: [phone]

## 2018-05-02 DIAGNOSIS — I10 Essential (primary) hypertension: Secondary | ICD-10-CM | POA: Diagnosis not present

## 2018-05-02 DIAGNOSIS — E782 Mixed hyperlipidemia: Secondary | ICD-10-CM | POA: Diagnosis not present

## 2018-05-02 DIAGNOSIS — I482 Chronic atrial fibrillation, unspecified: Secondary | ICD-10-CM | POA: Diagnosis not present

## 2018-05-02 DIAGNOSIS — E46 Unspecified protein-calorie malnutrition: Secondary | ICD-10-CM | POA: Diagnosis not present

## 2018-09-03 DIAGNOSIS — E46 Unspecified protein-calorie malnutrition: Secondary | ICD-10-CM | POA: Diagnosis not present

## 2018-09-03 DIAGNOSIS — I10 Essential (primary) hypertension: Secondary | ICD-10-CM | POA: Diagnosis not present

## 2018-09-03 DIAGNOSIS — N183 Chronic kidney disease, stage 3 (moderate): Secondary | ICD-10-CM | POA: Diagnosis not present

## 2018-09-03 DIAGNOSIS — I482 Chronic atrial fibrillation, unspecified: Secondary | ICD-10-CM | POA: Diagnosis not present

## 2018-10-10 ENCOUNTER — Inpatient Hospital Stay (HOSPITAL_COMMUNITY)
Admission: EM | Admit: 2018-10-10 | Discharge: 2018-10-12 | DRG: 312 | Disposition: A | Discharge status: Home / Self Care | Payer: Medicare Other | Attending: Internal Medicine | Admitting: Internal Medicine

## 2018-10-10 ENCOUNTER — Encounter (HOSPITAL_COMMUNITY): Payer: Self-pay

## 2018-10-10 ENCOUNTER — Other Ambulatory Visit: Payer: Self-pay

## 2018-10-10 ENCOUNTER — Emergency Department (HOSPITAL_COMMUNITY): Payer: Medicare Other

## 2018-10-10 ENCOUNTER — Inpatient Hospital Stay (HOSPITAL_COMMUNITY): Payer: Medicare Other

## 2018-10-10 DIAGNOSIS — I951 Orthostatic hypotension: Secondary | ICD-10-CM | POA: Diagnosis not present

## 2018-10-10 DIAGNOSIS — Z9181 History of falling: Secondary | ICD-10-CM | POA: Diagnosis not present

## 2018-10-10 DIAGNOSIS — Y92009 Unspecified place in unspecified non-institutional (private) residence as the place of occurrence of the external cause: Secondary | ICD-10-CM

## 2018-10-10 DIAGNOSIS — R7303 Prediabetes: Secondary | ICD-10-CM | POA: Diagnosis present

## 2018-10-10 DIAGNOSIS — Z79899 Other long term (current) drug therapy: Secondary | ICD-10-CM

## 2018-10-10 DIAGNOSIS — Z86010 Personal history of colonic polyps: Secondary | ICD-10-CM | POA: Diagnosis not present

## 2018-10-10 DIAGNOSIS — R000 Tachycardia, unspecified: Secondary | ICD-10-CM | POA: Diagnosis not present

## 2018-10-10 DIAGNOSIS — I4510 Unspecified right bundle-branch block: Secondary | ICD-10-CM | POA: Diagnosis not present

## 2018-10-10 DIAGNOSIS — Z7982 Long term (current) use of aspirin: Secondary | ICD-10-CM

## 2018-10-10 DIAGNOSIS — E785 Hyperlipidemia, unspecified: Secondary | ICD-10-CM | POA: Diagnosis present

## 2018-10-10 DIAGNOSIS — E860 Dehydration: Secondary | ICD-10-CM | POA: Diagnosis not present

## 2018-10-10 DIAGNOSIS — I1310 Hypertensive heart and chronic kidney disease without heart failure, with stage 1 through stage 4 chronic kidney disease, or unspecified chronic kidney disease: Secondary | ICD-10-CM | POA: Diagnosis present

## 2018-10-10 DIAGNOSIS — D539 Nutritional anemia, unspecified: Secondary | ICD-10-CM | POA: Diagnosis present

## 2018-10-10 DIAGNOSIS — I2510 Atherosclerotic heart disease of native coronary artery without angina pectoris: Secondary | ICD-10-CM | POA: Diagnosis not present

## 2018-10-10 DIAGNOSIS — N183 Chronic kidney disease, stage 3 (moderate): Secondary | ICD-10-CM | POA: Diagnosis not present

## 2018-10-10 DIAGNOSIS — H9190 Unspecified hearing loss, unspecified ear: Secondary | ICD-10-CM | POA: Diagnosis present

## 2018-10-10 DIAGNOSIS — I482 Chronic atrial fibrillation, unspecified: Secondary | ICD-10-CM | POA: Diagnosis present

## 2018-10-10 DIAGNOSIS — R0902 Hypoxemia: Secondary | ICD-10-CM | POA: Diagnosis not present

## 2018-10-10 DIAGNOSIS — R197 Diarrhea, unspecified: Secondary | ICD-10-CM | POA: Diagnosis present

## 2018-10-10 DIAGNOSIS — W19XXXA Unspecified fall, initial encounter: Secondary | ICD-10-CM | POA: Diagnosis present

## 2018-10-10 DIAGNOSIS — Z7901 Long term (current) use of anticoagulants: Secondary | ICD-10-CM

## 2018-10-10 DIAGNOSIS — N390 Urinary tract infection, site not specified: Secondary | ICD-10-CM | POA: Diagnosis not present

## 2018-10-10 DIAGNOSIS — D631 Anemia in chronic kidney disease: Secondary | ICD-10-CM | POA: Diagnosis present

## 2018-10-10 DIAGNOSIS — I480 Paroxysmal atrial fibrillation: Secondary | ICD-10-CM | POA: Diagnosis not present

## 2018-10-10 DIAGNOSIS — N179 Acute kidney failure, unspecified: Secondary | ICD-10-CM | POA: Diagnosis present

## 2018-10-10 DIAGNOSIS — Z85820 Personal history of malignant melanoma of skin: Secondary | ICD-10-CM

## 2018-10-10 DIAGNOSIS — J150 Pneumonia due to Klebsiella pneumoniae: Secondary | ICD-10-CM | POA: Diagnosis present

## 2018-10-10 DIAGNOSIS — D509 Iron deficiency anemia, unspecified: Secondary | ICD-10-CM | POA: Diagnosis present

## 2018-10-10 DIAGNOSIS — Z20828 Contact with and (suspected) exposure to other viral communicable diseases: Secondary | ICD-10-CM | POA: Diagnosis not present

## 2018-10-10 DIAGNOSIS — I493 Ventricular premature depolarization: Secondary | ICD-10-CM | POA: Diagnosis present

## 2018-10-10 DIAGNOSIS — B9689 Other specified bacterial agents as the cause of diseases classified elsewhere: Secondary | ICD-10-CM

## 2018-10-10 DIAGNOSIS — Z87891 Personal history of nicotine dependence: Secondary | ICD-10-CM | POA: Diagnosis not present

## 2018-10-10 DIAGNOSIS — R55 Syncope and collapse: Secondary | ICD-10-CM | POA: Diagnosis not present

## 2018-10-10 DIAGNOSIS — I340 Nonrheumatic mitral (valve) insufficiency: Secondary | ICD-10-CM | POA: Diagnosis not present

## 2018-10-10 LAB — URINALYSIS, ROUTINE W REFLEX MICROSCOPIC
Bilirubin Urine: NEGATIVE
Glucose, UA: NEGATIVE mg/dL
Ketones, ur: NEGATIVE mg/dL
Nitrite: NEGATIVE
Protein, ur: NEGATIVE mg/dL
RBC / HPF: >50 RBC/hpf — ABNORMAL HIGH (ref 0–5)
Specific Gravity, Urine: 1.01 (ref 1.005–1.030)
WBC, UA: >50 WBC/hpf — ABNORMAL HIGH (ref 0–5)
pH: 5 (ref 5.0–8.0)

## 2018-10-10 LAB — CBC WITH DIFFERENTIAL/PLATELET
Abs Immature Granulocytes: 0.03 10*3/uL (ref 0.00–0.07)
Basophils Absolute: 0 10*3/uL (ref 0.0–0.1)
Basophils Relative: 0 %
Eosinophils Absolute: 0 10*3/uL (ref 0.0–0.5)
Eosinophils Relative: 0 %
HCT: 31.7 % — ABNORMAL LOW (ref 39.0–52.0)
Hemoglobin: 9.7 g/dL — ABNORMAL LOW (ref 13.0–17.0)
Immature Granulocytes: 0 %
Lymphocytes Relative: 7 %
Lymphs Abs: 0.6 10*3/uL — ABNORMAL LOW (ref 0.7–4.0)
MCH: 33.6 pg (ref 26.0–34.0)
MCHC: 30.6 g/dL (ref 30.0–36.0)
MCV: 109.7 fL — ABNORMAL HIGH (ref 80.0–100.0)
Monocytes Absolute: 0.8 10*3/uL (ref 0.1–1.0)
Monocytes Relative: 11 %
Neutro Abs: 6.3 10*3/uL (ref 1.7–7.7)
Neutrophils Relative %: 82 %
Platelets: 133 10*3/uL — ABNORMAL LOW (ref 150–400)
RBC: 2.89 MIL/uL — ABNORMAL LOW (ref 4.22–5.81)
RDW: 14.1 % (ref 11.5–15.5)
WBC: 7.7 10*3/uL (ref 4.0–10.5)
nRBC: 0 % (ref 0.0–0.2)

## 2018-10-10 LAB — FERRITIN: Ferritin: 145 ng/mL (ref 24–336)

## 2018-10-10 LAB — BASIC METABOLIC PANEL
Anion gap: 8 (ref 5–15)
BUN: 39 mg/dL — ABNORMAL HIGH (ref 8–23)
CO2: 20 mmol/L — ABNORMAL LOW (ref 22–32)
Calcium: 7.5 mg/dL — ABNORMAL LOW (ref 8.9–10.3)
Chloride: 113 mmol/L — ABNORMAL HIGH (ref 98–111)
Creatinine, Ser: 2.3 mg/dL — ABNORMAL HIGH (ref 0.61–1.24)
GFR calc Af Amer: 27 mL/min — ABNORMAL LOW (ref >60)
GFR calc non Af Amer: 24 mL/min — ABNORMAL LOW (ref >60)
Glucose, Bld: 102 mg/dL — ABNORMAL HIGH (ref 70–99)
Potassium: 4.5 mmol/L (ref 3.5–5.1)
Sodium: 141 mmol/L (ref 135–145)

## 2018-10-10 LAB — RETICULOCYTES
Immature Retic Fract: 18.2 % — ABNORMAL HIGH (ref 2.3–15.9)
RBC.: 2.86 MIL/uL — ABNORMAL LOW (ref 4.22–5.81)
Retic Count, Absolute: 39.8 10*3/uL (ref 19.0–186.0)
Retic Ct Pct: 1.4 % (ref 0.4–3.1)

## 2018-10-10 LAB — TSH: TSH: 2.042 u[IU]/mL (ref 0.350–4.500)

## 2018-10-10 LAB — PROTIME-INR
INR: 1.6 — ABNORMAL HIGH (ref 0.8–1.2)
Prothrombin Time: 18.6 seconds — ABNORMAL HIGH (ref 11.4–15.2)

## 2018-10-10 LAB — IRON AND TIBC
Iron: 9 ug/dL — ABNORMAL LOW (ref 45–182)
Saturation Ratios: 4 % — ABNORMAL LOW (ref 17.9–39.5)
TIBC: 202 ug/dL — ABNORMAL LOW (ref 250–450)
UIBC: 193 ug/dL

## 2018-10-10 LAB — VITAMIN B12: Vitamin B-12: 152 pg/mL — ABNORMAL LOW (ref 180–914)

## 2018-10-10 LAB — SARS CORONAVIRUS 2 (TAT 6-24 HRS): SARS Coronavirus 2: NEGATIVE

## 2018-10-10 MED ORDER — SODIUM CHLORIDE 0.9 % IV BOLUS
1000.0000 mL | Freq: Once | INTRAVENOUS | Status: AC
  Administered 2018-10-10: 1000 mL via INTRAVENOUS

## 2018-10-10 MED ORDER — SODIUM CHLORIDE 0.45 % IV SOLN
INTRAVENOUS | Status: AC
  Administered 2018-10-10 – 2018-10-11 (×2): via INTRAVENOUS

## 2018-10-10 MED ORDER — SIMVASTATIN 10 MG PO TABS
10.0000 mg | ORAL_TABLET | Freq: Every day | ORAL | Status: DC
  Administered 2018-10-10 – 2018-10-11 (×2): 10 mg via ORAL
  Filled 2018-10-10 (×3): qty 1

## 2018-10-10 MED ORDER — MIRTAZAPINE 15 MG PO TABS
15.0000 mg | ORAL_TABLET | Freq: Every day | ORAL | Status: DC
  Administered 2018-10-10 – 2018-10-11 (×2): 15 mg via ORAL
  Filled 2018-10-10: qty 1
  Filled 2018-10-10 (×2): qty 2

## 2018-10-10 MED ORDER — METOPROLOL TARTRATE 25 MG PO TABS
50.0000 mg | ORAL_TABLET | Freq: Two times a day (BID) | ORAL | Status: DC
  Administered 2018-10-10 – 2018-10-12 (×4): 50 mg via ORAL
  Filled 2018-10-10 (×4): qty 2

## 2018-10-10 MED ORDER — DILTIAZEM HCL ER COATED BEADS 120 MG PO CP24
120.0000 mg | ORAL_CAPSULE | Freq: Every day | ORAL | Status: DC
  Administered 2018-10-11 – 2018-10-12 (×2): 120 mg via ORAL
  Filled 2018-10-10 (×2): qty 1

## 2018-10-10 MED ORDER — SODIUM CHLORIDE 0.9 % IV SOLN
Freq: Once | INTRAVENOUS | Status: AC
  Administered 2018-10-10: 17:00:00 via INTRAVENOUS

## 2018-10-10 MED ORDER — ENSURE ENLIVE PO LIQD
237.0000 mL | Freq: Two times a day (BID) | ORAL | Status: DC
  Administered 2018-10-12 (×2): 237 mL via ORAL

## 2018-10-10 NOTE — ED Notes (Signed)
Report called to [place], [place]

## 2018-10-10 NOTE — ED Notes (Signed)
ED TO INPATIENT HANDOFF REPORT  Name/Age/Gender James Strong 85 y.o. male  Code Status    Code Status Orders  (From admission, onward)         Start     Ordered   10/10/18 1742  Limited resuscitation (code)  Continuous    Question Answer Comment  In the event of cardiac or respiratory ARREST: Initiate Code Blue, Call Rapid Response Yes   In the event of cardiac or respiratory ARREST: Perform CPR Yes   In the event of cardiac or respiratory ARREST: Perform Intubation/Mechanical Ventilation No   In the event of cardiac or respiratory ARREST: Use NIPPV/BiPAp only if indicated Yes   In the event of cardiac or respiratory ARREST: Administer ACLS medications if indicated Yes   In the event of cardiac or respiratory ARREST: Perform Defibrillation or Cardioversion if indicated Yes      10/10/18 1742        Code Status History    This patient has a current code status but no historical code status.   Advance Care Planning Activity      Home/SNF/Other Home  Chief Complaint syncope  Level of Care/Admitting Diagnosis ED Disposition    ED Disposition Condition [facility]: [place] [[id number]]  Level of Care: Telemetry [5]  Admit to tele based on following criteria: Monitor for Ischemic changes  Covid Evaluation: Asymptomatic Screening Protocol (No Symptoms)  Diagnosis: Syncope [206001]  Admitting Physician: Kayleen Memos [[id number]]  Attending Physician: Kayleen Memos [[id number]]  Estimated length of stay: past midnight tomorrow  Certification:: I certify this patient will need inpatient services for at least 2 midnights  PT Class (Do Not Modify): Inpatient [101]  PT Acc Code (Do Not Modify): Private [1]       Medical History Past Medical History:  Diagnosis Date  . Anemia   . Benign hypertensive heart disease without heart failure   . Chronic anticoagulation   . CKD (chronic kidney disease)   . Colon polyp   . Coronary  atherosclerosis of native coronary artery    nonobstructive ASCAD - 30-40% mid LAD and 30% mid PL by cath 2006  . Hyperlipidemia   . Iron deficiency   . Melanoma of skin, site unspecified   . Osteoarthrosis   . PAF (paroxysmal atrial fibrillation) (HCC)    on warfarin for CHADS2VASC score of 4  . Prediabetes     Allergies No Known Allergies  IV Location/Drains/Wounds Patient Lines/Drains/Airways Status   Active Line/Drains/Airways    Name:   Placement date:   Placement time:   Site:   Days:   Peripheral IV Anterior;Left Forearm   -    -    Forearm             Labs/Imaging Results for orders placed or performed during the hospital encounter of 10/10/18 (from the past 48 hour(s))  CBC with Differential/Platelet     Status: Abnormal   Collection Time: 10/10/18  2:17 PM  Result Value Ref Range   WBC 7.7 4.0 - 10.5 K/uL   RBC 2.89 (L) 4.22 - 5.81 MIL/uL   Hemoglobin 9.7 (L) 13.0 - 17.0 g/dL   HCT 31.7 (L) 39.0 - 52.0 %   MCV 109.7 (H) 80.0 - 100.0 fL   MCH 33.6 26.0 - 34.0 pg   MCHC 30.6 30.0 - 36.0 g/dL   RDW 14.1 11.5 - 15.5 %   Platelets 133 (L) 150 - 400 K/uL  Comment: Immature Platelet Fraction may be clinically indicated, consider ordering this additional test [id number]    nRBC 0.0 0.0 - 0.2 %   Neutrophils Relative % 82 %   Neutro Abs 6.3 1.7 - 7.7 K/uL   Lymphocytes Relative 7 %   Lymphs Abs 0.6 (L) 0.7 - 4.0 K/uL   Monocytes Relative 11 %   Monocytes Absolute 0.8 0.1 - 1.0 K/uL   Eosinophils Relative 0 %   Eosinophils Absolute 0.0 0.0 - 0.5 K/uL   Basophils Relative 0 %   Basophils Absolute 0.0 0.0 - 0.1 K/uL   Immature Granulocytes 0 %   Abs Immature Granulocytes 0.03 0.00 - 0.07 K/uL    Comment: Performed at [facility], [place] [address], [place], [place] [address]  Basic metabolic panel     Status: Abnormal   Collection Time: 10/10/18  2:17 PM  Result Value Ref Range   Sodium 141 135 - 145 mmol/L   Potassium 4.5 3.5 - 5.1 mmol/L    Chloride 113 (H) 98 - 111 mmol/L   CO2 20 (L) 22 - 32 mmol/L   Glucose, Bld 102 (H) 70 - 99 mg/dL   BUN 39 (H) 8 - 23 mg/dL   Creatinine, Ser 2.30 (H) 0.61 - 1.24 mg/dL   Calcium 7.5 (L) 8.9 - 10.3 mg/dL   GFR calc non Af Amer 24 (L) >60 mL/min   GFR calc Af Amer 27 (L) >60 mL/min   Anion gap 8 5 - 15    Comment: Performed at [facility], [place] [address] [place], [place] [address]  Protime-INR     Status: Abnormal   Collection Time: 10/10/18  5:09 PM  Result Value Ref Range   Prothrombin Time 18.6 (H) 11.4 - 15.2 seconds   INR 1.6 (H) 0.8 - 1.2    Comment: (NOTE) INR goal varies based on device and disease states. Performed at [facility], [place] [address] [place], [place] [address]    Ct Head Wo Contrast  Result Date: 10/10/2018 CLINICAL DATA:  Syncope EXAM: CT HEAD WITHOUT CONTRAST TECHNIQUE: Contiguous axial images were obtained from the base of the skull through the vertex without intravenous contrast. COMPARISON:  None. FINDINGS: Brain: No evidence of acute territorial infarction, hemorrhage, hydrocephalus,extra-axial collection or mass lesion/mass effect. There is dilatation the ventricles and sulci consistent with age-related atrophy. Low-attenuation changes in the deep white matter consistent with small vessel ischemia. Vascular: No hyperdense vessel or unexpected calcification. Calcifications are seen within the internal carotid artery. Skull: The skull is intact. No fracture or focal lesion identified. Sinuses/Orbits: A small amount of fluid seen within the left mastoid air cells. The visualized paranasal sinuses are otherwise well aerated. Other: None IMPRESSION: 1. No acute intracranial abnormality. 2. Findings consistent with age related atrophy and chronic small vessel ischemia Electronically Signed   By: Prudencio Pair M.D.   On: 10/10/2018 15:13    Pending Labs Unresulted Labs (From admission, onward)    Start     Ordered   10/10/18  1534  SARS CORONAVIRUS 2 Nasal Swab Aptima Multi Swab  (Asymptomatic/Tier 2 Patients Labs)  Once,   STAT    Question Answer Comment  Is this test for diagnosis or screening Screening   Symptomatic for COVID-19 as defined by CDC No   Hospitalized for COVID-19 No   Admitted to ICU for COVID-19 No   Previously tested for COVID-19 No   Resident in a congregate (group) care setting No  Employed in healthcare setting No      10/10/18 1533   10/10/18 1313  Urinalysis, Routine w reflex microscopic  Once,   STAT     10/10/18 1313          Vitals/Pain Today's Vitals   10/10/18 1615 10/10/18 1630 10/10/18 1645 10/10/18 1700  BP:  (!) 126/58  130/77  Pulse: (!) 57 67 81 (!) 121  Resp: 19 (!) 22 17 (!) 23  Temp:      TempSrc:      SpO2: 96% 94% 97% (!) 81%  PainSc:        Isolation Precautions No active isolations  Medications Medications  sodium chloride 0.9 % bolus 1,000 mL (0 mLs Intravenous Stopped 10/10/18 1520)  0.9 %  sodium chloride infusion ( Intravenous New Bag/Given 10/10/18 1714)    Mobility walks

## 2018-10-10 NOTE — H&P (Addendum)
History and Physical  JERMEY CLOSS DXA:[id number] DOB: [birth year] DOA: 10/10/2018  Referring physician: Dr. Francia Greaves PCP: Wenda Low, MD  Outpatient Specialists: Cardiology, Dr. Radford Pax Patient coming from: Home  Chief Complaint: Passed out.  HPI: James Strong is a 85 y.o. male with medical history significant for chronic A. fib not on oral anticoagulation, coronary artery disease, hypertension, hyperlipidemia, ambulatory dysfunction with rolling walker at baseline, who presented to [facility] ED after passing out at home.  Patient reports no dizziness, palpitations or chest pain prior to passing out.  When regained consciousness no significant weakness or lethargy, no loss of bladder or bowel control.  Was on the floor for about 2 hours and could not get up on his own due to no cartilage in his knees.  His son lives with him.  Denies prior syncopal episodes.  Last fall was about a year ago, different from this fall.  Reports watery stools once a day in the last 3 days and poor oral intake.  No abdominal cramping.  Denies any cardiopulmonary symptoms.  ED Course: Positive orthostatic vital signs, lab studies remarkable for elevated creatinine.  TRH asked to admit for syncope and AKI.  Review of Systems: Review of systems as noted in the HPI. All other systems reviewed and are negative.   Past Medical History:  Diagnosis Date  . Anemia   . Benign hypertensive heart disease without heart failure   . Chronic anticoagulation   . CKD (chronic kidney disease)   . Colon polyp   . Coronary atherosclerosis of native coronary artery    nonobstructive ASCAD - 30-40% mid LAD and 30% mid PL by cath 2006  . Hyperlipidemia   . Iron deficiency   . Melanoma of skin, site unspecified   . Osteoarthrosis   . PAF (paroxysmal atrial fibrillation) (HCC)    on warfarin for CHADS2VASC score of 4  . Prediabetes    Past Surgical History:  Procedure Laterality Date  . MELANOMA EXCISION     superficial on  scalp  . skin melonoma removed from scalp      Social History:  reports that he quit smoking about 48 years ago. He has never used smokeless tobacco. He reports current alcohol use. No history on file for drug.   No Known Allergies  Family History  Problem Relation Age of Onset  . Dementia Father       Prior to Admission medications   Medication Sig Start Date End Date Taking? Authorizing Provider  aspirin EC 81 MG tablet Take 81 mg by mouth daily.   Yes [provider]  diltiazem (TIAZAC) 120 MG 24 hr capsule Take 120 mg by mouth daily.   Yes [provider]  metoprolol (LOPRESSOR) 50 MG tablet Take 50 mg by mouth 2 (two) times daily.   Yes [provider]  mirtazapine (REMERON) 15 MG tablet Take 15 mg by mouth at bedtime.   Yes [provider]  simvastatin (ZOCOR) 20 MG tablet Take 10 mg by mouth at bedtime.  11/09/12  Yes [provider]    Physical Exam: BP 130/77   Pulse (!) 121   Temp 98.1 F (36.7 C) (Oral)   Resp (!) 23   SpO2 (!) 81%   . General: 85 y.o. year-old male well developed well nourished in no acute distress.  Alert and oriented x4. . Cardiovascular: Regular rate and rhythm with no rubs or gallops.  No thyromegaly or JVD noted.  No lower extremity edema. 2/4  pulses in all 4 extremities. Marland Kitchen Respiratory: Clear to auscultation with no wheezes or rales. Good inspiratory effort. . Abdomen: Soft nontender nondistended with normal bowel sounds x4 quadrants. . Muskuloskeletal: No cyanosis, clubbing or edema noted bilaterally . Neuro: CN II-XII intact, strength, sensation, reflexes . Skin: No ulcerative lesions noted or rashes.  Very dry skin in lower extremities bilaterally. Marland Kitchen Psychiatry: Judgement and insight appear normal. Mood is appropriate for condition and setting          Labs on Admission:  Basic Metabolic Panel: Recent Labs  Lab 10/10/18 1417  NA 141  K 4.5  CL 113*  CO2 20*  GLUCOSE 102*  BUN 39*   CREATININE 2.30*  CALCIUM 7.5*   Liver Function Tests: No results for input(s): AST, ALT, ALKPHOS, BILITOT, PROT, ALBUMIN in the last 168 hours. No results for input(s): LIPASE, AMYLASE in the last 168 hours. No results for input(s): AMMONIA in the last 168 hours. CBC: Recent Labs  Lab 10/10/18 1417  WBC 7.7  NEUTROABS 6.3  HGB 9.7*  HCT 31.7*  MCV 109.7*  PLT 133*   Cardiac Enzymes: No results for input(s): CKTOTAL, CKMB, CKMBINDEX, TROPONINI in the last 168 hours.  BNP (last 3 results) No results for input(s): BNP in the last 8760 hours.  ProBNP (last 3 results) No results for input(s): PROBNP in the last 8760 hours.  CBG: No results for input(s): GLUCAP in the last 168 hours.  Radiological Exams on Admission: Ct Head Wo Contrast  Result Date: 10/10/2018 CLINICAL DATA:  Syncope EXAM: CT HEAD WITHOUT CONTRAST TECHNIQUE: Contiguous axial images were obtained from the base of the skull through the vertex without intravenous contrast. COMPARISON:  None. FINDINGS: Brain: No evidence of acute territorial infarction, hemorrhage, hydrocephalus,extra-axial collection or mass lesion/mass effect. There is dilatation the ventricles and sulci consistent with age-related atrophy. Low-attenuation changes in the deep white matter consistent with small vessel ischemia. Vascular: No hyperdense vessel or unexpected calcification. Calcifications are seen within the internal carotid artery. Skull: The skull is intact. No fracture or focal lesion identified. Sinuses/Orbits: A small amount of fluid seen within the left mastoid air cells. The visualized paranasal sinuses are otherwise well aerated. Other: None IMPRESSION: 1. No acute intracranial abnormality. 2. Findings consistent with age related atrophy and chronic small vessel ischemia Electronically Signed   By: Prudencio Pair M.D.   On: 10/10/2018 15:13    EKG: I independently viewed the EKG done and my findings are as followed: A. fib rate of 87  and QTC 497.  Assessment/Plan Present on Admission: . Syncope  Active Problems:   Syncope  Syncope, possibly cardiogenic Presented after unwitnessed fall Patient states he passed out, no symptoms prior and after passing out Closely monitor on telemetry Syncope work-up CT head no acute intracranial findings 2D echo Carotid Doppler ultrasound bilaterally Positive orthostatic vital signs May benefit from Holter monitor/cardiac monitoring at discharge  AKI likely prerenal in the setting of dehydration Baseline creatinine appears to be 1.6 with GFR of 37 Presented with creatinine of 2.30 with GFR of 24 Closely monitor urine output Avoid nephrotoxins Gentle IV fluid hydration half-normal saline at 75 cc/h Repeat BMP in the morning Obtain renal ultrasound  Chronic A. Fib Not on oral anticoagulation due to high risk for falls Closely monitor on telemetry Rate controlled on Cardizem p.o. and p.o. Lopressor  Watery stools/poor oral intake Self reports watery stools once a day in the last 3 days Encourage increasing oral protein calorie intake  Macrocytic  anemia/anemia of chronic disease in the setting of CKD 3 Baseline hemoglobin appears to be 12 Presented with hemoglobin of 9.7 Obtain FOBT Obtain iron studies, vitamin B12 and folate levels, reticulocyte count Monitor H&H  Hypertension Blood pressure is at goal Resume home antihypertensives  Hyperlipidemia Resume Zocor  Ambulatory dysfunction Uses a rolling walker at baseline PT OT to assess tomorrow Fall precautions  Risks: High risk decompensation due to syncope, multiple comorbidities and advanced age.  Patient will require at least 2 midnights for further evaluation and treatment of present condition.   DVT prophylaxis: Subcu heparin 3 times daily  Code Status: DO NOT INTUBATE as stated by the patient who is alert and oriented x4, with daughter present in the room.  Family Communication: Daughter present in  the room.  All questions answered to her satisfaction.  Disposition Plan: Admit to telemetry unit  Consults called: None  Admission status: Inpatient status.    Kayleen Memos MD [facility] Pager [phone]  If 7PM-7AM, please contact night-coverage [email or web address] Password [facility]  10/10/2018, 5:44 PM

## 2018-10-10 NOTE — ED Triage Notes (Signed)
EMS reports from home, son called for fall. Pt states walking through his living room using walker and fell to floor. Denies LOC or pain, no obvious injuries.   BP 108/60 HR 90 RR 18 Sp02 96 RA CBG 163  20ga L forearm

## 2018-10-10 NOTE — ED Provider Notes (Signed)
[facility] DEPT Provider Note   CSN: [id number] Arrival date & time: 10/10/18  1247     History   Chief Complaint Chief Complaint  Patient presents with  . 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     The history is provided by the patient and a relative. No language interpreter was used.  Fall     85 year old male with history of anemia, COPD, paroxysmal atrial fibrillation, currently not on any blood thinner medication aside from baby aspirin, CAD presenting for evaluation of a syncopal episode.  History obtained through patient and through daughter who is at bedside.  Patient report after he was using his walker to walk around the house this morning when he had an apparent syncopal episode.  He himself on the ground passing out, and laying on his left side.  He attempted to crawl to get to the phone but unable to reach it.  He believes he may have been on the floor for at least 2 to 3 hours.  His son who came home and noticed that he was on the ground and probably called EMS.  When EMS arrived and helped him up he was able to move about and states that he did not have any significant pain at that time.  He denies having headache, neck pain, chest pain, trouble breathing, abdominal pain, back pain, or pain to his extremities.  He denies any dysuria.  He denies any recent sickness.  He has never passed out with this before.  He did recall having several bouts of loose stools for the past 2 days.     Past Medical History:  Diagnosis Date  . Anemia   . Benign hypertensive heart disease without heart failure   . Chronic anticoagulation   . CKD (chronic kidney disease)   . Colon polyp   . Coronary atherosclerosis of native coronary artery    nonobstructive ASCAD - 30-40% mid LAD and 30% mid PL by cath 2006  . Hyperlipidemia   . Iron deficiency   . Melanoma of skin, site unspecified   . Osteoarthrosis   . PAF (paroxysmal atrial fibrillation)  (HCC)    on warfarin for CHADS2VASC score of 4  . Prediabetes     Patient Active Problem List   Diagnosis Date Noted  . RBBB (right bundle branch block with left anterior fascicular block) 12/13/2012  . Persistent atrial fibrillation 12/13/2012  . Chronic anticoagulation   . CAD (coronary artery disease)   . Prediabetes   . Hyperlipidemia   . Melanoma of skin, site unspecified   . Anemia   . Iron deficiency   . Osteoarthrosis   . CKD (chronic kidney disease)   . HTN (hypertension)   . Colon polyp     Past Surgical History:  Procedure Laterality Date  . MELANOMA EXCISION     superficial on scalp  . skin melonoma removed from scalp          Home Medications    Prior to Admission medications   Medication Sig Start Date End Date Taking? Authorizing Provider  diltiazem (TIAZAC) 120 MG 24 hr capsule Take 120 mg by mouth daily.    [provider]  metoprolol (LOPRESSOR) 50 MG tablet Take 50 mg by mouth 2 (two) times daily.    [provider]  mirtazapine (REMERON) 15 MG tablet Take 15 mg by mouth at bedtime.    [provider]  simvastatin (ZOCOR)  20 MG tablet Take 10 mg by mouth daily. Take 1/2 tablet by mouth daily 11/09/12   [provider]    Family History Family History  Problem Relation Age of Onset  . Dementia Father     Social History Social History   Tobacco Use  . Smoking status: Former Smoker    Quit date: 12/12/1969    Years since quitting: 48.8  . Smokeless tobacco: Never Used  Substance Use Topics  . Alcohol use: Yes    Comment: occasionally  . Drug use: Not on file     Allergies   Patient has no known allergies.   Review of Systems Review of Systems  All other systems reviewed and are negative.    Physical Exam Updated Vital Signs BP 118/70   Pulse 88   Temp 98.1 F (36.7 C) (Oral)   Resp 16   SpO2 96%   Physical Exam Vitals signs and nursing note reviewed.  Constitutional:      General:  He is not in acute distress.    Appearance: He is well-developed.  HENT:     Head: Normocephalic and atraumatic.  Eyes:     Extraocular Movements: Extraocular movements intact.     Conjunctiva/sclera: Conjunctivae normal.     Pupils: Pupils are equal, round, and reactive to light.  Neck:     Musculoskeletal: Neck supple.  Cardiovascular:     Rate and Rhythm: Rhythm irregular.     Pulses: Normal pulses.     Heart sounds: Normal heart sounds.  Pulmonary:     Effort: Pulmonary effort is normal.     Breath sounds: Normal breath sounds. No wheezing, rhonchi or rales.  Abdominal:     Palpations: Abdomen is soft.     Tenderness: There is no abdominal tenderness.  Musculoskeletal:     Comments: 5 out of 5 strength all 4 extremities with intact distal pulses  Skin:    Findings: No rash.  Neurological:     Mental Status: He is alert and oriented to person, place, and time.     Cranial Nerves: Cranial nerves are intact.     Sensory: Sensation is intact.     Motor: Motor function is intact.  Psychiatric:        Mood and Affect: Mood normal.      ED Treatments / Results  Labs (all labs ordered are listed, but only abnormal results are displayed) Labs Reviewed  CBC WITH DIFFERENTIAL/PLATELET  BASIC METABOLIC PANEL  URINALYSIS, ROUTINE W REFLEX MICROSCOPIC    EKG None  Radiology No results found.  Procedures Procedures (including critical care time)  Medications Ordered in ED Medications - No data to display   Initial Impression / Assessment and Plan / ED Course  I have reviewed the triage vital signs and the nursing notes.  Pertinent labs & imaging results that were available during my care of the patient were reviewed by me and considered in my medical decision making (see chart for details).        BP 118/70   Pulse 88   Temp 98.1 F (36.7 C) (Oral)   Resp 16   SpO2 96%    Final Clinical Impressions(s) / ED Diagnoses   Final diagnoses:  Syncope and  collapse  Fall at home, initial encounter    ED Discharge Orders    None     1:54 PM Patient had a syncopal episode unable to get off the floor.  Incident happened this AM.  He  is at baseline, no seizure activity.  He is currently not on any anticoagulant, does have hx of PAF. No evidence of injury. Work up initiated.  Anticipate admission.   2:16 PM EKG shows A. fib with a PVC, awaits the remainder of labs  3:09 PM Pt sign out to Dr. Francia Greaves who will f/u on labs and will likely admit for syncope work up.    Domenic Moras, PA-C 10/10/18 1510    Davonna Belling, MD 10/11/18 1500

## 2018-10-10 NOTE — ED Provider Notes (Signed)
Patient seen after signout from prior ED provider.  Patient is presenting for evaluation of possible syncopal event.  History is suggestive of mild dehydration secondary to mild diarrhea.  Labs report this with mildly elevated BUN and creatinine.  Patient would likely benefit from admission for IV fluids and further work-up and observation.  Hospitalist service is aware case and will evaluate for admission.   Valarie Merino, MD 10/10/18 [phone]

## 2018-10-11 ENCOUNTER — Inpatient Hospital Stay (HOSPITAL_COMMUNITY): Payer: Medicare Other

## 2018-10-11 DIAGNOSIS — R55 Syncope and collapse: Secondary | ICD-10-CM

## 2018-10-11 DIAGNOSIS — E860 Dehydration: Secondary | ICD-10-CM

## 2018-10-11 DIAGNOSIS — I340 Nonrheumatic mitral (valve) insufficiency: Secondary | ICD-10-CM

## 2018-10-11 LAB — BASIC METABOLIC PANEL
Anion gap: 9 (ref 5–15)
BUN: 37 mg/dL — ABNORMAL HIGH (ref 8–23)
CO2: 17 mmol/L — ABNORMAL LOW (ref 22–32)
Calcium: 7.7 mg/dL — ABNORMAL LOW (ref 8.9–10.3)
Chloride: 112 mmol/L — ABNORMAL HIGH (ref 98–111)
Creatinine, Ser: 2.17 mg/dL — ABNORMAL HIGH (ref 0.61–1.24)
GFR calc Af Amer: 29 mL/min — ABNORMAL LOW (ref >60)
GFR calc non Af Amer: 25 mL/min — ABNORMAL LOW (ref >60)
Glucose, Bld: 100 mg/dL — ABNORMAL HIGH (ref 70–99)
Potassium: 4.4 mmol/L (ref 3.5–5.1)
Sodium: 138 mmol/L (ref 135–145)

## 2018-10-11 LAB — ECHOCARDIOGRAM COMPLETE
Height: 69 in
Weight: 2052.92 oz

## 2018-10-11 LAB — FOLATE RBC
Folate, Hemolysate: 279 ng/mL
Folate, RBC: 1077 ng/mL (ref >498)
Hematocrit: 25.9 % — ABNORMAL LOW (ref 37.5–51.0)

## 2018-10-11 MED ORDER — SODIUM CHLORIDE 0.9 % IV SOLN
510.0000 mg | Freq: Once | INTRAVENOUS | Status: AC
  Administered 2018-10-11: 510 mg via INTRAVENOUS
  Filled 2018-10-11: qty 17

## 2018-10-11 NOTE — Progress Notes (Signed)
PROGRESS NOTE    James Strong  ACZ:[id number] DOB: [birth year] DOA: 10/10/2018 PCP: Wenda Low, MD   Brief Narrative:  James Strong is a 85 y.o. male with medical history significant for chronic A. fib not on oral anticoagulation, coronary artery disease, hypertension, hyperlipidemia, ambulatory dysfunction with rolling walker at baseline, who presented to [facility] ED after passing out at home.  Patient reports no dizziness, palpitations or chest pain prior to passing out.  When regained consciousness no significant weakness or lethargy, no loss of bladder or bowel control.  Was on the floor for about 2 hours and could not get up on his own due to no cartilage in his knees.  His son lives with him.  Denies prior syncopal episodes.  Last fall was about a year ago, different from this fall.  Reports watery stools once a day in the last 3 days and poor oral intake.  No abdominal cramping.  Denies any cardiopulmonary symptoms.  ED Course: Positive orthostatic vital signs, lab studies remarkable for elevated creatinine.  TRH asked to admit for syncope and AKI   Assessment & Plan:   Active Problems:   Syncope   Syncope, possibly cardiogenic Presented after unwitnessed fall Patient states he passed out, no symptoms prior and after passing out Closely monitor on telemetry Syncope work-up pending CT head no acute intracranial findings 2D echo pending Carotid Doppler ultrasound bilaterally pending Positive orthostatic vital signs-continue to hydrate NS 75/hr May benefit from Holter monitor/cardiac monitoring at discharge  AKI likely prerenal in the setting of dehydration Baseline creatinine appears to be 1.6 with GFR of 37 Presented with creatinine of 2.30 with GFR of 24-repeat labs in AM Closely monitor urine output AvoidING nephrotoxins As above, gentle IV fluid hydration half-normal saline at 75 cc/h Repeat BMP in the morning Read pending for renal ultrasound  Chronic A.  Fib Not on oral anticoagulation due to high risk for falls Closely monitor on telemetry Rate controlled on Cardizem p.o. and p.o. Lopressor  Watery stools/poor oral intake Self reports watery stools once a day in the last 3 days Encourage increasing oral protein calorie intake No active diarrhea  Macrocytic anemia/anemia of chronic disease in the setting of CKD 3 Baseline hemoglobin appears to be 12 Presented with hemoglobin of 9.7 Obtain FOBT pending Iron deficient-adding Monitor H&H  Hypertension Blood pressure is at goal Resume home antihypertensives  Hyperlipidemia Resume Zocor  Ambulatory dysfunction Uses a rolling walker at baseline PT OT to assess  Fall precautions  DVT prophylaxis: Heparin SQ  Code Status: Partial    Code Status Orders  (From admission, onward)         Start     Ordered   10/10/18 1742  Limited resuscitation (code)  Continuous    Question Answer Comment  In the event of cardiac or respiratory ARREST: Initiate Code Blue, Call Rapid Response Yes   In the event of cardiac or respiratory ARREST: Perform CPR Yes   In the event of cardiac or respiratory ARREST: Perform Intubation/Mechanical Ventilation No   In the event of cardiac or respiratory ARREST: Use NIPPV/BiPAp only if indicated Yes   In the event of cardiac or respiratory ARREST: Administer ACLS medications if indicated Yes   In the event of cardiac or respiratory ARREST: Perform Defibrillation or Cardioversion if indicated Yes      10/10/18 1742        Code Status History    This patient has a current code status but no historical  code status.   Advance Care Planning Activity    Advance Directive Documentation     Most Recent Value  Type of Advance Directive  Living will  Pre-existing out of facility DNR order (yellow form or pink MOST form)  --  "MOST" Form in Place?  --     Family Communication: called daughter, N/A Disposition Plan:   Patient remained inpatient for  an additional day requiring IV fluids, electrolyte monitoring, and further evaluation with physical therapy and Occupational Therapy.  Without these treatments patient at risk of clinical deterioration Consults called: None Admission status: Inpatient   Consultants:   None  Procedures:  Ct Head Wo Contrast  Result Date: 10/10/2018 CLINICAL DATA:  Syncope EXAM: CT HEAD WITHOUT CONTRAST TECHNIQUE: Contiguous axial images were obtained from the base of the skull through the vertex without intravenous contrast. COMPARISON:  None. FINDINGS: Brain: No evidence of acute territorial infarction, hemorrhage, hydrocephalus,extra-axial collection or mass lesion/mass effect. There is dilatation the ventricles and sulci consistent with age-related atrophy. Low-attenuation changes in the deep white matter consistent with small vessel ischemia. Vascular: No hyperdense vessel or unexpected calcification. Calcifications are seen within the internal carotid artery. Skull: The skull is intact. No fracture or focal lesion identified. Sinuses/Orbits: A small amount of fluid seen within the left mastoid air cells. The visualized paranasal sinuses are otherwise well aerated. Other: None IMPRESSION: 1. No acute intracranial abnormality. 2. Findings consistent with age related atrophy and chronic small vessel ischemia Electronically Signed   By: Prudencio Pair M.D.   On: 10/10/2018 15:13   Vas US Carotid  Result Date: 10/11/2018 Carotid Arterial Duplex Study Indications:  Syncope. Risk Factors: Hyperlipidemia. Performing Technologist: Maudry Mayhew MHA, RDMS, RVT, RDCS  Examination Guidelines: A complete evaluation includes B-mode imaging, spectral Doppler, color Doppler, and power Doppler as needed of all accessible portions of each vessel. Bilateral testing is considered an integral part of a complete examination. Limited examinations for reoccurring indications may be performed as noted.  Right Carotid Findings:  +----------+--------+--------+--------+-----------------------+--------+             PSV cm/s EDV cm/s Stenosis Plaque Description      Comments  +----------+--------+--------+--------+-----------------------+--------+  CCA Prox   53       14                                                  +----------+--------+--------+--------+-----------------------+--------+  CCA Distal 48       14                                                  +----------+--------+--------+--------+-----------------------+--------+  ICA Prox   44       11                smooth and heterogenous           +----------+--------+--------+--------+-----------------------+--------+  ICA Distal 62       15                                                  +----------+--------+--------+--------+-----------------------+--------+  ECA        95                                                           +----------+--------+--------+--------+-----------------------+--------+ +----------+--------+-------+--------------+-------------------+             PSV cm/s EDV cms Describe       Arm Pressure (mmHG)  +----------+--------+-------+--------------+-------------------+  Subclavian                  Not identified                      +----------+--------+-------+--------------+-------------------+ +---------+--------+--+--------+--+---------+  Vertebral PSV cm/s 47 EDV cm/s 15 Antegrade  +---------+--------+--+--------+--+---------+   Left Carotid Findings: +----------+--------+--------+--------+--------------------------+--------+             PSV cm/s EDV cm/s Stenosis Plaque Description         Comments  +----------+--------+--------+--------+--------------------------+--------+  CCA Prox   78       14                                                     +----------+--------+--------+--------+--------------------------+--------+  CCA Distal 67       16                irregular and heterogenous            +----------+--------+--------+--------+--------------------------+--------+  ICA Prox   38       12                heterogenous and irregular           +----------+--------+--------+--------+--------------------------+--------+  ICA Distal 54       17                                                     +----------+--------+--------+--------+--------------------------+--------+  ECA        64                         calcific                             +----------+--------+--------+--------+--------------------------+--------+ +----------+--------+--------+----------------+-------------------+  Subclavian PSV cm/s EDV cm/s Describe         Arm Pressure (mmHG)  +----------+--------+--------+----------------+-------------------+             78                Multiphasic, WNL                      +----------+--------+--------+----------------+-------------------+ +---------+--------+--+--------+--+---------+  Vertebral PSV cm/s 49 EDV cm/s 25 Antegrade  +---------+--------+--+--------+--+---------+   Summary: Right Carotid: Velocities in the right ICA are consistent with a 1-39% stenosis. Left Carotid: Velocities in the left ICA are consistent with a 1-39% stenosis. Vertebrals:  Bilateral vertebral arteries demonstrate antegrade flow. Subclavians: Right subclavian artery was not visualized. Normal flow  hemodynamics were seen in the left subclavian artery. *See table(s) above for measurements and observations.     Preliminary      Antimicrobials:   None   Subjective: Patient reports he feels okay today With no acute events overnight No evidence of syncope  Objective: Vitals:   10/10/18 2205 10/11/18 0500 10/11/18 0558 10/11/18 1007  BP: (!) 115/57  121/66 124/68  Pulse: 70  68 98  Resp:   16   Temp:   99.9 F (37.7 C)   TempSrc:   Oral   SpO2:   93%   Weight:  58.2 kg    Height:  5\' 9"  (1.753 m)      Intake/Output Summary (Last 24 hours) at 10/11/2018 1203 Last data filed at  10/11/2018 0800 Gross per 24 hour  Intake 3043.25 ml  Output 400 ml  Net 2643.25 ml   Filed Weights   10/11/18 0500  Weight: 58.2 kg    Examination:  General exam: Appears calm and comfortable  Respiratory system: Clear to auscultation. Respiratory effort normal. Cardiovascular system: S1 & S2 heard, RRR. No JVD, murmurs, rubs, gallops or clicks. No pedal edema. Gastrointestinal system: Abdomen is nondistended, soft and nontender. No organomegaly or masses felt. Normal bowel sounds heard. Central nervous system: Alert and oriented. No focal neurological deficits. Extremities: Warm well perfused, no contractures Skin: Hemosiderin staining with face status changes on bilateral lower extremities, tenting consistent with dehydration  psychiatry: Judgement and insight appear normal. Mood & affect appropriate.     Data Reviewed: I have personally reviewed following labs and imaging studies  CBC: Recent Labs  Lab 10/10/18 1417  WBC 7.7  NEUTROABS 6.3  HGB 9.7*  HCT 31.7*  MCV 109.7*  PLT 562*   Basic Metabolic Panel: Recent Labs  Lab 10/10/18 1417 10/11/18 0851  NA 141 138  K 4.5 4.4  CL 113* 112*  CO2 20* 17*  GLUCOSE 102* 100*  BUN 39* 37*  CREATININE 2.30* 2.17*  CALCIUM 7.5* 7.7*   GFR: Estimated Creatinine Clearance: 17.5 mL/min (A) (by C-G formula based on SCr of 2.17 mg/dL (H)). Liver Function Tests: No results for input(s): AST, ALT, ALKPHOS, BILITOT, PROT, ALBUMIN in the last 168 hours. No results for input(s): LIPASE, AMYLASE in the last 168 hours. No results for input(s): AMMONIA in the last 168 hours. Coagulation Profile: Recent Labs  Lab 10/10/18 1709  INR 1.6*   Cardiac Enzymes: No results for input(s): CKTOTAL, CKMB, CKMBINDEX, TROPONINI in the last 168 hours. BNP (last 3 results) No results for input(s): PROBNP in the last 8760 hours. HbA1C: No results for input(s): HGBA1C in the last 72 hours. CBG: No results for input(s): GLUCAP in the  last 168 hours. Lipid Profile: No results for input(s): CHOL, HDL, LDLCALC, TRIG, CHOLHDL, LDLDIRECT in the last 72 hours. Thyroid Function Tests: Recent Labs    10/10/18 1858  TSH 2.042   Anemia Panel: Recent Labs    10/10/18 1417 10/10/18 1858  VITAMINB12  --  152*  FERRITIN  --  145  TIBC  --  202*  IRON  --  9*  RETICCTPCT 1.4  --    Sepsis Labs: No results for input(s): PROCALCITON, LATICACIDVEN in the last 168 hours.  Recent Results (from the past 240 hour(s))  SARS CORONAVIRUS 2 Nasal Swab Aptima Multi Swab     Status: None   Collection Time: 10/10/18  4:22 PM   Specimen: Aptima Multi Swab; Nasal Swab  Result Value Ref Range Status  SARS Coronavirus 2 NEGATIVE NEGATIVE Final    Comment: (NOTE) SARS-CoV-2 target nucleic acids are NOT DETECTED. The SARS-CoV-2 RNA is generally detectable in upper and lower respiratory specimens during the acute phase of infection. Negative results do not preclude SARS-CoV-2 infection, do not rule out co-infections with other pathogens, and should not be used as the sole basis for treatment or other patient management decisions. Negative results must be combined with clinical observations, patient history, and epidemiological information. The expected result is Negative. Fact Sheet for Patients: SugarRoll.be Fact Sheet for Healthcare Providers: https://www.woods-mathews.com/ This test is not yet approved or cleared by the [place] FDA and  has been authorized for detection and/or diagnosis of SARS-CoV-2 by FDA under an Emergency Use Authorization (EUA). This EUA will remain  in effect (meaning this test can be used) for the duration of the COVID-19 declaration under Section 56 4(b)(1) of the Act, 21 U.S.C. section 360bbb-3(b)(1), unless the authorization is terminated or revoked sooner. Performed at [facility], [place] [address], [place], [place] [address]           Radiology Studies: Ct Head Wo Contrast  Result Date: 10/10/2018 CLINICAL DATA:  Syncope EXAM: CT HEAD WITHOUT CONTRAST TECHNIQUE: Contiguous axial images were obtained from the base of the skull through the vertex without intravenous contrast. COMPARISON:  None. FINDINGS: Brain: No evidence of acute territorial infarction, hemorrhage, hydrocephalus,extra-axial collection or mass lesion/mass effect. There is dilatation the ventricles and sulci consistent with age-related atrophy. Low-attenuation changes in the deep white matter consistent with small vessel ischemia. Vascular: No hyperdense vessel or unexpected calcification. Calcifications are seen within the internal carotid artery. Skull: The skull is intact. No fracture or focal lesion identified. Sinuses/Orbits: A small amount of fluid seen within the left mastoid air cells. The visualized paranasal sinuses are otherwise well aerated. Other: None IMPRESSION: 1. No acute intracranial abnormality. 2. Findings consistent with age related atrophy and chronic small vessel ischemia Electronically Signed   By: Prudencio Pair M.D.   On: 10/10/2018 15:13   Vas US Carotid  Result Date: 10/11/2018 Carotid Arterial Duplex Study Indications:  Syncope. Risk Factors: Hyperlipidemia. Performing Technologist: Maudry Mayhew MHA, RDMS, RVT, RDCS  Examination Guidelines: A complete evaluation includes B-mode imaging, spectral Doppler, color Doppler, and power Doppler as needed of all accessible portions of each vessel. Bilateral testing is considered an integral part of a complete examination. Limited examinations for reoccurring indications may be performed as noted.  Right Carotid Findings: +----------+--------+--------+--------+-----------------------+--------+             PSV cm/s EDV cm/s Stenosis Plaque Description      Comments  +----------+--------+--------+--------+-----------------------+--------+  CCA Prox   53       14                                                   +----------+--------+--------+--------+-----------------------+--------+  CCA Distal 48       14                                                  +----------+--------+--------+--------+-----------------------+--------+  ICA Prox   44       11  smooth and heterogenous           +----------+--------+--------+--------+-----------------------+--------+  ICA Distal 62       15                                                  +----------+--------+--------+--------+-----------------------+--------+  ECA        95                                                           +----------+--------+--------+--------+-----------------------+--------+ +----------+--------+-------+--------------+-------------------+             PSV cm/s EDV cms Describe       Arm Pressure (mmHG)  +----------+--------+-------+--------------+-------------------+  Subclavian                  Not identified                      +----------+--------+-------+--------------+-------------------+ +---------+--------+--+--------+--+---------+  Vertebral PSV cm/s 47 EDV cm/s 15 Antegrade  +---------+--------+--+--------+--+---------+   Left Carotid Findings: +----------+--------+--------+--------+--------------------------+--------+             PSV cm/s EDV cm/s Stenosis Plaque Description         Comments  +----------+--------+--------+--------+--------------------------+--------+  CCA Prox   78       14                                                     +----------+--------+--------+--------+--------------------------+--------+  CCA Distal 67       16                irregular and heterogenous           +----------+--------+--------+--------+--------------------------+--------+  ICA Prox   38       12                heterogenous and irregular           +----------+--------+--------+--------+--------------------------+--------+  ICA Distal 54       17                                                      +----------+--------+--------+--------+--------------------------+--------+  ECA        64                         calcific                             +----------+--------+--------+--------+--------------------------+--------+ +----------+--------+--------+----------------+-------------------+  Subclavian PSV cm/s EDV cm/s Describe         Arm Pressure (mmHG)  +----------+--------+--------+----------------+-------------------+             78                Multiphasic, WNL                      +----------+--------+--------+----------------+-------------------+ +---------+--------+--+--------+--+---------+  Vertebral PSV cm/s 49 EDV cm/s 25 Antegrade  +---------+--------+--+--------+--+---------+   Summary: Right Carotid: Velocities in the right ICA are consistent with a 1-39% stenosis. Left Carotid: Velocities in the left ICA are consistent with a 1-39% stenosis. Vertebrals:  Bilateral vertebral arteries demonstrate antegrade flow. Subclavians: Right subclavian artery was not visualized. Normal flow              hemodynamics were seen in the left subclavian artery. *See table(s) above for measurements and observations.     Preliminary         Scheduled Meds:  diltiazem  120 mg Oral Daily   feeding supplement (ENSURE ENLIVE)  237 mL Oral BID BM   metoprolol tartrate  50 mg Oral BID   mirtazapine  15 mg Oral QHS   simvastatin  10 mg Oral QHS   Continuous Infusions:  sodium chloride 75 mL/hr at 10/11/18 1013     LOS: 1 day    Time spent: 35 min    Nicolette Bang, MD [facility]  If 7PM-7AM, please contact night-coverage  10/11/2018, 12:03 PM

## 2018-10-11 NOTE — Progress Notes (Signed)
Carotid artery duplex completed. Refer to "CV Proc" under chart review to view preliminary results.  10/11/2018 10:13 AM Maudry Mayhew, MHA, RVT, RDCS, RDMS

## 2018-10-11 NOTE — Evaluation (Addendum)
Physical Therapy Evaluation Patient Details Name: James Strong MRN: [id number] DOB: [birth year] Today's Date: 10/11/2018   History of Present Illness  James Strong is a 85 y.o. male with medical history significant for chronic A. fib not on oral anticoagulation, coronary artery disease, hypertension, hyperlipidemia, ambulatory dysfunction with rolling walker at baseline, who presented to [facility] ED after fall in house while ambulating with RW.    Clinical Impression  James Strong is a 85 y.o. male admitted with above HPI. He reports at baseline he is independent for household mobility with use of RW. He lives with his son but his son works during the day. Patient is currently limited by functional impairments (see PT Problem List below) and now requires min assist for bed mobility and transfers, and min guard for gait with RW. BP monitored with transitional movement and remained stable with no symptoms. Pt amb to bathroom and required assistance to complete hygiene following BM. He wishes to return home and his family feels that is appropriate. I discussed need for greater assistance at home to ensure pt safety with pt and daughter; they will discuss this with family and determine if they can provide this. Pt's daughter reports someone always checks in on him during the day. Patient will benefit from continued skilled PT with [facility] services once he returns home to address impairments. Acute PT will follow and progress mobility as able.    Follow Up Recommendations Home health PT;Supervision for mobility/OOB(pt wishes to go home, declines facility)    Equipment Recommendations  None recommended by PT    Recommendations for Other Services       Precautions / Restrictions Precautions Precautions: Fall Restrictions Weight Bearing Restrictions: No      Mobility  Bed Mobility Overal bed mobility: Needs Assistance Bed Mobility: Supine to Sit     Supine to sit: Min assist     General  bed mobility comments: pt requires cues for sequencing log roll and assist to initiate press up from Lt sidelying  Transfers Overall transfer level: Needs assistance Equipment used: Rolling walker (2 wheeled) Transfers: Sit to/from Stand Sit to Stand: Min assist         General transfer comment: sit<>stand from EOB, toilet, bedside recliner; min assist required from EOB with cues for safe hand placement however pt did not follow cues, pt directed in safe hand placement prior to stand from toilet and demosntrated correct placement  Ambulation/Gait   Gait Distance (Feet): 30 Feet(2x 15' to bathroom and back to recliner) Assistive device: Rolling walker (2 wheeled) Gait Pattern/deviations: Step-through pattern;Decreased step length - right;Decreased step length - left;Decreased stride length Gait velocity: decreased   General Gait Details: pt with overall steady gait using RW, reliant on UE support for stability, cues required for safety while negotiating turns in hospital room  Stairs            Wheelchair Mobility    Modified Rankin (Stroke Patients Only)       Balance Overall balance assessment: Needs assistance Sitting-balance support: Feet unsupported;Single extremity supported Sitting balance-Leahy Scale: Good Sitting balance - Comments: pt able to maintain balance with single UE support while taking seated BP for orthostatic pressure, pt's BP remained stable with no symptoms, pt scooted to EOB to place feet on floor   Standing balance support: Bilateral upper extremity supported;During functional activity Standing balance-Leahy Scale: Fair Standing balance comment: pt reliant on UE support with RW for standing  Pertinent Vitals/Pain Pain Assessment: No/denies pain    Home Living Family/patient expects to be discharged to:: Private residence Living Arrangements: Children(pt lives with son, Audry Pili) Available Help at Discharge: Family(pt's  son (works during day until 3pm) pt has 2 daughters close by and another son who checks in throughout the day) Type of Home: House Home Access: Stairs to enter Entrance Stairs-Rails: Left(Lt while ascending) Entrance Stairs-Number of Steps: 3 Home Layout: One level [place]: Bedside commode;Walker - 2 wheels;Walker - 4 wheels;Cane - single point;Shower seat;Grab bars - tub/shower Additional Comments: pt does not use the shower and instead takes sponge bathes as he is fearful of falling in shower    Prior Function Level of Independence: Independent with assistive device(s)         Comments: pt and his daughter report he has been independent with RW for household mobility, his son cooks for him and pt does not shower but is independent for sponge bathes and dressing     Hand Dominance   Dominant Hand: Right    Extremity/Trunk Assessment   Upper Extremity Assessment Upper Extremity Assessment: Defer to OT evaluation    Lower Extremity Assessment Lower Extremity Assessment: Generalized weakness    Cervical / Trunk Assessment Cervical / Trunk Assessment: Kyphotic  Communication   Communication: HOH  Cognition Arousal/Alertness: Awake/alert Behavior During Therapy: WFL for tasks assessed/performed Overall Cognitive Status: Within Functional Limits for tasks assessed          General Comments: pt's daughter present and reports occasional memory errors for her father             Assessment/Plan    PT Assessment Patient needs continued PT services  PT Problem List Decreased strength;Decreased activity tolerance;Decreased mobility;Decreased balance       PT Treatment Interventions DME instruction;Stair training;Therapeutic activities;Balance training;Gait training;Functional mobility training;Therapeutic exercise;Patient/family education    PT Goals (Current goals can be found in the Care Plan section)  Acute Rehab PT Goals Patient Stated Goal: pt wants to  return home PT Goal Formulation: With patient Time For Goal Achievement: 10/25/18 Potential to Achieve Goals: Good    Frequency Min 3X/week   Barriers to discharge   pt is alone during the day when his son is at work, discussed with family the need for pt to have supervision and assist for mobility if he is to return home       AM-PAC PT "6 Clicks" Mobility  Outcome Measure Help needed turning from your back to your side while in a flat bed without using bedrails?: A Little Help needed moving from lying on your back to sitting on the side of a flat bed without using bedrails?: A Little Help needed moving to and from a bed to a chair (including a wheelchair)?: A Little Help needed standing up from a chair using your arms (e.g., wheelchair or bedside chair)?: A Little Help needed to walk in hospital room?: A Little Help needed climbing 3-5 steps with a railing? : A Little 6 Click Score: 18    End of Session Equipment Utilized During Treatment: Gait belt Activity Tolerance: Patient tolerated treatment well Patient left: in chair;with family/visitor present;with chair alarm set;with call bell/phone within reach Nurse Communication: Mobility status PT Visit Diagnosis: Difficulty in walking, not elsewhere classified (R26.2);History of falling (Z91.81)    Time: 1638-4665 PT Time Calculation (min) (ACUTE ONLY): 36 min   Charges:   PT Evaluation $PT Eval Low Complexity: 1 Low PT Treatments $Therapeutic Activity: 8-22 mins  Kipp Brood, PT, DPT, [facility] Physical Therapist with [facility]  10/11/2018 5:36 PM

## 2018-10-11 NOTE — Progress Notes (Signed)
  Echocardiogram 2D Echocardiogram has been performed.  James Strong L Androw 10/11/2018, 1:55 PM

## 2018-10-11 NOTE — Progress Notes (Signed)
OT Cancellation Note  Patient Details Name: BEDFORD WINSOR MRN: [id number] DOB: Jun 30, 1923   Cancelled Treatment:    Reason Eval/Treat Not Completed: Fatigue/lethargy limiting ability to participate. Pt very kind but ask OT to return later this day. Will check back as schedule allows  Kari Baars, [place] [phone] Office- [phone], Edwena Felty D 10/11/2018, 11:00 AM

## 2018-10-12 DIAGNOSIS — B9689 Other specified bacterial agents as the cause of diseases classified elsewhere: Secondary | ICD-10-CM

## 2018-10-12 DIAGNOSIS — N390 Urinary tract infection, site not specified: Secondary | ICD-10-CM

## 2018-10-12 LAB — CBC WITH DIFFERENTIAL/PLATELET
Abs Immature Granulocytes: 0.03 10*3/uL (ref 0.00–0.07)
Basophils Absolute: 0 10*3/uL (ref 0.0–0.1)
Basophils Relative: 0 %
Eosinophils Absolute: 0 10*3/uL (ref 0.0–0.5)
Eosinophils Relative: 0 %
HCT: 28.1 % — ABNORMAL LOW (ref 39.0–52.0)
Hemoglobin: 8.7 g/dL — ABNORMAL LOW (ref 13.0–17.0)
Immature Granulocytes: 1 %
Lymphocytes Relative: 11 %
Lymphs Abs: 0.6 10*3/uL — ABNORMAL LOW (ref 0.7–4.0)
MCH: 34 pg (ref 26.0–34.0)
MCHC: 31 g/dL (ref 30.0–36.0)
MCV: 109.8 fL — ABNORMAL HIGH (ref 80.0–100.0)
Monocytes Absolute: 1 10*3/uL (ref 0.1–1.0)
Monocytes Relative: 19 %
Neutro Abs: 3.7 10*3/uL (ref 1.7–7.7)
Neutrophils Relative %: 69 %
Platelets: 104 10*3/uL — ABNORMAL LOW (ref 150–400)
RBC: 2.56 MIL/uL — ABNORMAL LOW (ref 4.22–5.81)
RDW: 14.3 % (ref 11.5–15.5)
WBC: 5.2 10*3/uL (ref 4.0–10.5)
nRBC: 0 % (ref 0.0–0.2)

## 2018-10-12 LAB — BASIC METABOLIC PANEL
Anion gap: 7 (ref 5–15)
BUN: 38 mg/dL — ABNORMAL HIGH (ref 8–23)
CO2: 20 mmol/L — ABNORMAL LOW (ref 22–32)
Calcium: 8 mg/dL — ABNORMAL LOW (ref 8.9–10.3)
Chloride: 111 mmol/L (ref 98–111)
Creatinine, Ser: 2.11 mg/dL — ABNORMAL HIGH (ref 0.61–1.24)
GFR calc Af Amer: 30 mL/min — ABNORMAL LOW (ref >60)
GFR calc non Af Amer: 26 mL/min — ABNORMAL LOW (ref >60)
Glucose, Bld: 91 mg/dL (ref 70–99)
Potassium: 4.3 mmol/L (ref 3.5–5.1)
Sodium: 138 mmol/L (ref 135–145)

## 2018-10-12 LAB — URINE CULTURE: Culture: >=100000 — AB

## 2018-10-12 MED ORDER — CEPHALEXIN 250 MG PO CAPS: 250.0000 mg | ORAL_CAPSULE | Freq: Three times a day (TID) | ORAL | 0 refills | Status: AC

## 2018-10-12 NOTE — Progress Notes (Signed)
CSW received a call from pt's RN stating pt has D/C orders in and has order for HHw/PT.  CSW consulted RN CM on duty at [phone] who states that, per chart, pt has declined [place] services, but states RN CM is standing by if pt changes pt's mind.  RN voiced understanding and will updated the provider.  Please reconsult if future social work needs arise.  CSW signing off, as social work intervention is no longer needed.  Alphonse Guild. Vanita Cannell, LCSW, LCAS, CSI Transitions of Care Clinical Social Worker Care Coordination Department Ph: [phone]

## 2018-10-12 NOTE — Care Management Important Message (Signed)
Important Message  Patient Details IM Letter given to Dessa Phi RN to present to the Patient Name: James Strong MRN: [id number] Date of Birth: Jun 23, 1923   Medicare Important Message Given:  Yes     Kerin Salen 10/12/2018, 11:28 AM

## 2018-10-12 NOTE — Discharge Summary (Signed)
Physician Discharge Summary  [name]:[id number] DOB: [birth year] DOA: 10/10/2018  PCP: Wenda Low, MD  Admit date: 10/10/2018 Discharge date: 10/12/2018  Admitted From: Home Discharge disposition: Home   Code Status: Partial Code   Recommendations for Outpatient Follow-Up:   1. Keep yourself hydrated at home 2. Follow-up with PCP as an outpatient  Discharge Diagnosis:   Active Problems:   Syncope   UTI due to Klebsiella species    History of Present Illness / Brief narrative:  James Strong a 85 y.o.malewith medical history significant forchronic A. fib not on oral anticoagulation, coronary artery disease, hypertension, hyperlipidemia, ambulatory dysfunction with rolling walker at baseline, who presented to [facility] ED after a syncope at home without any premonitory symptoms. On admission, patient reported 3-5 days hx of poor oral intake and watery stool.   In the ED, orthostatic vital signs positive.  Work-up showed elevated creatinine. Was admitted for syncope and AKI. Urine culture obtained on admission subsequently showed E. coli.  Subjective:  Patient was seen and examined this morning.  Pleasant elderly Caucasian male.  Lying in bed.  Not in distress.  Feels better than at presentation. Is not sure if he has any urinary symptoms.  Has chronic lower urinary tract symptoms like related to large prostate.  Hospital Course:   Orthostatic syncope -Presented with syncope.  Found to have positive orthostatic drop in blood pressure. -IV hydration provided.  Telemetry monitoring reviewed.  No arrhythmia noted. -CT head no acute intracranial findings -Echocardiogram showed EF 50 to 55%  -Carotid duplex bilateral showed 1 to 39% stenosis, nonsignificant.   -Not on any diuretics at home.  Encouraged to maintain oral hydration. -Has history of hypertension.  Acute kidney injury -Creatinine elevated to 2.3 on admission, 1.6 at baseline. -likely prerenal  due to dehydration -Continue IV hydration with a low rate  Klebsiella UTI -Urine culture obtained on admission shows more than 100,000 CFU per mL Klebsiella pneumonia. -We will discharge on 3-day course of Keflex.  Cardiovascular issues: chronic A. Fib/hypertension/hyperlipidemia -Rate controlled on oral Cardizem and Lopressor.  Not on anticoagulation because of risk of falls.  Continue to monitor in telemetry -Blood pressure is at goal -Continue Zocor.  Watery stools/poor oral intake Selfreports watery stools once a day in the last 3 days prior to presentation Encourage increasing oral protein calorie intake No active diarrhea  Macrocytic anemia/anemia of chronic disease in the setting of CKD 3 Baseline hemoglobin appears to be 12 Presented with hemoglobin of 9.7 Ferritin level 145, iron saturation low, TIBC elevated.  Likely chronic disease anemia.  Ambulatory dysfunction Uses a rolling walker at baseline PT evaluation obtained.  Recommend home health PT.  Discharge Exam:   Vitals:   10/12/18 0448 10/12/18 0450 10/12/18 1030 10/12/18 1252  BP: 110/72  116/70 138/80  Pulse: 99  (!) 102 (!) 102  Resp: 16   16  Temp: 97.7 F (36.5 C)   98.7 F (37.1 C)  TempSrc: Oral   Axillary  SpO2: 95%   95%  Weight:  59.9 kg    Height:        Body mass index is 19.5 kg/m.  General exam: Appears calm and comfortable.  Skin: No rashes, lesions or ulcers. HEENT: Atraumatic, normocephalic, supple neck, no obvious bleeding Lungs: Clear to auscultation bilaterally CVS: Regular rate and rhythm, no murmur GI/Abd soft, nontender, nondistended, bowel sound present CNS: Alert, awake, oriented x3, hard of hearing Psychiatry: Mood appropriate Extremities: No pedal edema, no calf  tenderness  Discharge Instructions:  Wound care: none Discharge Instructions    Increase activity slowly   Complete by: As directed       Allergies as of 10/12/2018   No Known Allergies       Medication List    TAKE these medications   aspirin EC 81 MG tablet Take 81 mg by mouth daily.   cephALEXin 250 MG capsule Commonly known as: KEFLEX Take 1 capsule (250 mg total) by mouth 3 (three) times daily for 3 days.   diltiazem 120 MG 24 hr capsule Commonly known as: TIAZAC Take 120 mg by mouth daily.   metoprolol tartrate 50 MG tablet Commonly known as: LOPRESSOR Take 50 mg by mouth 2 (two) times daily.   mirtazapine 15 MG tablet Commonly known as: REMERON Take 15 mg by mouth at bedtime.   simvastatin 20 MG tablet Commonly known as: ZOCOR Take 10 mg by mouth at bedtime.       Time coordinating discharge: 35 minutes  The results of significant diagnostics from this hospitalization (including imaging, microbiology, ancillary and laboratory) are listed below for reference.    Procedures and Diagnostic Studies:   Ct Head Wo Contrast  Result Date: 10/10/2018 CLINICAL DATA:  Syncope EXAM: CT HEAD WITHOUT CONTRAST TECHNIQUE: Contiguous axial images were obtained from the base of the skull through the vertex without intravenous contrast. COMPARISON:  None. FINDINGS: Brain: No evidence of acute territorial infarction, hemorrhage, hydrocephalus,extra-axial collection or mass lesion/mass effect. There is dilatation the ventricles and sulci consistent with age-related atrophy. Low-attenuation changes in the deep white matter consistent with small vessel ischemia. Vascular: No hyperdense vessel or unexpected calcification. Calcifications are seen within the internal carotid artery. Skull: The skull is intact. No fracture or focal lesion identified. Sinuses/Orbits: A small amount of fluid seen within the left mastoid air cells. The visualized paranasal sinuses are otherwise well aerated. Other: None IMPRESSION: 1. No acute intracranial abnormality. 2. Findings consistent with age related atrophy and chronic small vessel ischemia Electronically Signed   By: Prudencio Pair M.D.   On:  10/10/2018 15:13   US Renal  Result Date: 10/12/2018 CLINICAL DATA:  Acute kidney injury. EXAM: RENAL / URINARY TRACT ULTRASOUND COMPLETE COMPARISON:  None. FINDINGS: Right Kidney: Renal measurements: 10.5 x 4.6 x 6.4 cm = volume: 161.2 mL. Cortex is thinned and cortical echogenicity is increased. Two cysts are identified measuring 6.3 and 3.6 cm in diameter. No solid mass. Left Kidney: Renal measurements: 9.3 x 5.0 x 5.0 cm = volume: 120.2 mL. Cortex is thinned and cortical echogenicity is increased. No solid mass. Two cysts are identified measuring 2.5 and 1.2 cm in diameter. Bladder: Fairly extensive debris is present in the urinary bladder. IMPRESSION: Negative for hydronephrosis or acute abnormality. Debris in the urinary bladder. Senescent change both kidneys. Electronically Signed   By: Inge Rise M.D.   On: 10/12/2018 10:47   Vas US Carotid  Result Date: 10/11/2018 Carotid Arterial Duplex Study Indications:  Syncope. Risk Factors: Hyperlipidemia. Performing Technologist: Maudry Mayhew MHA, RDMS, RVT, RDCS  Examination Guidelines: A complete evaluation includes B-mode imaging, spectral Doppler, color Doppler, and power Doppler as needed of all accessible portions of each vessel. Bilateral testing is considered an integral part of a complete examination. Limited examinations for reoccurring indications may be performed as noted.  Right Carotid Findings: +----------+--------+--------+--------+-----------------------+--------+             PSV cm/s EDV cm/s Stenosis Plaque Description      Comments  +----------+--------+--------+--------+-----------------------+--------+  CCA Prox   53       14                                                  +----------+--------+--------+--------+-----------------------+--------+  CCA Distal 48       14                                                  +----------+--------+--------+--------+-----------------------+--------+  ICA Prox   44       11                 smooth and heterogenous           +----------+--------+--------+--------+-----------------------+--------+  ICA Distal 62       15                                                  +----------+--------+--------+--------+-----------------------+--------+  ECA        95                                                           +----------+--------+--------+--------+-----------------------+--------+ +----------+--------+-------+--------------+-------------------+             PSV cm/s EDV cms Describe       Arm Pressure (mmHG)  +----------+--------+-------+--------------+-------------------+  Subclavian                  Not identified                      +----------+--------+-------+--------------+-------------------+ +---------+--------+--+--------+--+---------+  Vertebral PSV cm/s 47 EDV cm/s 15 Antegrade  +---------+--------+--+--------+--+---------+   Left Carotid Findings: +----------+--------+--------+--------+--------------------------+--------+             PSV cm/s EDV cm/s Stenosis Plaque Description         Comments  +----------+--------+--------+--------+--------------------------+--------+  CCA Prox   78       14                                                     +----------+--------+--------+--------+--------------------------+--------+  CCA Distal 67       16                irregular and heterogenous           +----------+--------+--------+--------+--------------------------+--------+  ICA Prox   38       12                heterogenous and irregular           +----------+--------+--------+--------+--------------------------+--------+  ICA Distal 54       17                                                     +----------+--------+--------+--------+--------------------------+--------+  ECA        64                         calcific                             +----------+--------+--------+--------+--------------------------+--------+ +----------+--------+--------+----------------+-------------------+   Subclavian PSV cm/s EDV cm/s Describe         Arm Pressure (mmHG)  +----------+--------+--------+----------------+-------------------+             78                Multiphasic, WNL                      +----------+--------+--------+----------------+-------------------+ +---------+--------+--+--------+--+---------+  Vertebral PSV cm/s 49 EDV cm/s 25 Antegrade  +---------+--------+--+--------+--+---------+   Summary: Right Carotid: Velocities in the right ICA are consistent with a 1-39% stenosis. Left Carotid: Velocities in the left ICA are consistent with a 1-39% stenosis. Vertebrals:  Bilateral vertebral arteries demonstrate antegrade flow. Subclavians: Right subclavian artery was not visualized. Normal flow              hemodynamics were seen in the left subclavian artery. *See table(s) above for measurements and observations.  Electronically signed by Servando Snare MD on 10/11/2018 at 1:32:04 PM.    Final      Labs:   Basic Metabolic Panel: Recent Labs  Lab 10/10/18 1417 10/11/18 0851 10/12/18 0442  NA 141 138 138  K 4.5 4.4 4.3  CL 113* 112* 111  CO2 20* 17* 20*  GLUCOSE 102* 100* 91  BUN 39* 37* 38*  CREATININE 2.30* 2.17* 2.11*  CALCIUM 7.5* 7.7* 8.0*   GFR Estimated Creatinine Clearance: 18.5 mL/min (A) (by C-G formula based on SCr of 2.11 mg/dL (H)). Liver Function Tests: No results for input(s): AST, ALT, ALKPHOS, BILITOT, PROT, ALBUMIN in the last 168 hours. No results for input(s): LIPASE, AMYLASE in the last 168 hours. No results for input(s): AMMONIA in the last 168 hours. Coagulation profile Recent Labs  Lab 10/10/18 1709  INR 1.6*    CBC: Recent Labs  Lab 10/10/18 1417 10/10/18 1858 10/12/18 0442  WBC 7.7  --  5.2  NEUTROABS 6.3  --  3.7  HGB 9.7*  --  8.7*  HCT 31.7* 25.9* 28.1*  MCV 109.7*  --  109.8*  PLT 133*  --  104*   Cardiac Enzymes: No results for input(s): CKTOTAL, CKMB, CKMBINDEX, TROPONINI in the last 168 hours. BNP: Invalid input(s):  POCBNP CBG: No results for input(s): GLUCAP in the last 168 hours. D-Dimer No results for input(s): DDIMER in the last 72 hours. Hgb A1c No results for input(s): HGBA1C in the last 72 hours. Lipid Profile No results for input(s): CHOL, HDL, LDLCALC, TRIG, CHOLHDL, LDLDIRECT in the last 72 hours. Thyroid function studies Recent Labs    10/10/18 1858  TSH 2.042   Anemia work up Recent Labs    10/10/18 1417 10/10/18 1858  VITAMINB12  --  152*  FERRITIN  --  145  TIBC  --  202*  IRON  --  9*  RETICCTPCT 1.4  --    Microbiology Recent Results (from the past 240 hour(s))  SARS CORONAVIRUS 2 Nasal Swab Aptima Multi Swab     Status: None   Collection Time: 10/10/18  4:22 PM   Specimen: Aptima Multi Swab; Nasal Swab  Result Value Ref Range Status  SARS Coronavirus 2 NEGATIVE NEGATIVE Final    Comment: (NOTE) SARS-CoV-2 target nucleic acids are NOT DETECTED. The SARS-CoV-2 RNA is generally detectable in upper and lower respiratory specimens during the acute phase of infection. Negative results do not preclude SARS-CoV-2 infection, do not rule out co-infections with other pathogens, and should not be used as the sole basis for treatment or other patient management decisions. Negative results must be combined with clinical observations, patient history, and epidemiological information. The expected result is Negative. Fact Sheet for Patients: SugarRoll.be Fact Sheet for Healthcare Providers: https://www.woods-mathews.com/ This test is not yet approved or cleared by the [place] FDA and  has been authorized for detection and/or diagnosis of SARS-CoV-2 by FDA under an Emergency Use Authorization (EUA). This EUA will remain  in effect (meaning this test can be used) for the duration of the COVID-19 declaration under Section 56 4(b)(1) of the Act, 21 U.S.C. section 360bbb-3(b)(1), unless the authorization is terminated or revoked  sooner. Performed at [facility], [place] [address] [place], [place] [address]   Culture, Urine     Status: Abnormal   Collection Time: 10/10/18  6:13 PM   Specimen: Urine, Clean Catch  Result Value Ref Range Status   Specimen Description   Final    URINE, CLEAN CATCH Performed at [facility], [place] [address] [place], [place] [address]    Special Requests   Final    NONE Performed at [facility], [place] [address] [place], [place] [address]    Culture >=100,000 COLONIES/mL KLEBSIELLA PNEUMONIAE (A)  Final   Report Status 10/12/2018 FINAL  Final   Organism ID, Bacteria KLEBSIELLA PNEUMONIAE (A)  Final      Susceptibility   Klebsiella pneumoniae - MIC*    AMPICILLIN >=32 RESISTANT Resistant     CEFAZOLIN <=4 SENSITIVE Sensitive     CEFTRIAXONE <=1 SENSITIVE Sensitive     CIPROFLOXACIN <=0.25 SENSITIVE Sensitive     GENTAMICIN <=1 SENSITIVE Sensitive     IMIPENEM <=0.25 SENSITIVE Sensitive     NITROFURANTOIN 64 INTERMEDIATE Intermediate     TRIMETH/SULFA <=20 SENSITIVE Sensitive     AMPICILLIN/SULBACTAM 4 SENSITIVE Sensitive     PIP/TAZO <=4 SENSITIVE Sensitive     Extended ESBL NEGATIVE Sensitive     * >=100,000 COLONIES/mL KLEBSIELLA PNEUMONIAE    Signed: Dessire Grimes  [facility] 10/12/2018, 5:29 PM

## 2018-10-12 NOTE — TOC Transition Note (Signed)
Transition of Care [facility]) - CM/SW Discharge Note   Patient Details  Name: James Strong MRN: [id number] Date of Birth: [birth year]  Transition of Care [facility]) CM/SW Contact:  Dessa Phi, RN Phone Number: 10/12/2018, 11:01 AM   Clinical Narrative: patient defers to dtr in rm [name]-PT recc HHPT-patient pleasantly declines HHPT, already has rw, w/c. Has own transport home. No further CM needs.      Final next level of care: Home/Self Care Barriers to Discharge: Continued Medical Work up   Patient Goals and CMS Choice Patient states their goals for this hospitalization and ongoing recovery are:: go home CMS Medicare.gov Compare Post Acute Care list provided to:: Patient Represenative (must comment)(dtr [name])    Discharge Placement                       Discharge Plan and Services                          HH Arranged: Patient Refused [facility]          Social Determinants of Health (SDOH) Interventions     Readmission Risk Interventions No flowsheet data found.

## 2018-10-12 NOTE — Progress Notes (Signed)
Physical Therapy Treatment Patient Details Name: James Strong MRN: [id number] DOB: 1923/12/04 Today's Date: 10/12/2018    History of Present Illness MORTON SIMSON is a 85 y.o. male with medical history significant for chronic A. fib not on oral anticoagulation, coronary artery disease, hypertension, hyperlipidemia, ambulatory dysfunction with rolling walker at baseline, who presented to [facility] ED after fall in house while ambulating with RW.    PT Comments    Pt progressing well.  incr amb distance/tolerance today; pt wanted to return back to bed.  Follow Up Recommendations  Home health PT;Supervision for mobility/OOB     Equipment Recommendations  None recommended by PT    Recommendations for Other Services       Precautions / Restrictions Precautions Precautions: Fall Restrictions Weight Bearing Restrictions: No    Mobility  Bed Mobility Overal bed mobility: Needs Assistance Bed Mobility: Supine to Sit;Sit to Supine     Supine to sit: Supervision Sit to supine: Supervision   General bed mobility comments: for safety, cues for self assist   Transfers Overall transfer level: Needs assistance Equipment used: Rolling walker (2 wheeled) Transfers: Sit to/from Stand Sit to Stand: Min guard         General transfer comment: cues for hand placement and overall safety  Ambulation/Gait Ambulation/Gait assistance: Min guard Gait Distance (Feet): 80 Feet Assistive device: Rolling walker (2 wheeled) Gait Pattern/deviations: Step-to pattern;Decreased stride length     General Gait Details: pt with overall steady gait using RW, reliant on UE support for stability, cues required for safety while negotiating turns    Chief Strategy Officer    Modified Rankin (Stroke Patients Only)       Balance               Standing balance comment: pt reliant on UE support with RW for standing                            Cognition  Arousal/Alertness: Awake/alert Behavior During Therapy: WFL for tasks assessed/performed Overall Cognitive Status: Within Functional Limits for tasks assessed                                        Exercises      General Comments        Pertinent Vitals/Pain      Home Living                      Prior Function            PT Goals (current goals can now be found in the care plan section) Acute Rehab PT Goals Patient Stated Goal: pt wants to return home PT Goal Formulation: With patient Time For Goal Achievement: 10/25/18 Potential to Achieve Goals: Good Progress towards PT goals: Progressing toward goals    Frequency    Min 3X/week      PT Plan Current plan remains appropriate    Co-evaluation              AM-PAC PT "6 Clicks" Mobility   Outcome Measure  Help needed turning from your back to your side while in a flat bed without using bedrails?: A Little Help needed moving from lying on your back to sitting on the side of  a flat bed without using bedrails?: A Little Help needed moving to and from a bed to a chair (including a wheelchair)?: A Little Help needed standing up from a chair using your arms (e.g., wheelchair or bedside chair)?: A Little Help needed to walk in hospital room?: A Little Help needed climbing 3-5 steps with a railing? : A Little 6 Click Score: 18    End of Session Equipment Utilized During Treatment: Gait belt Activity Tolerance: Patient tolerated treatment well Patient left: with call bell/phone within reach;in bed;with bed alarm set;with family/visitor present   PT Visit Diagnosis: Difficulty in walking, not elsewhere classified (R26.2);History of falling (Z91.81)     Time: 1308-6578 PT Time Calculation (min) (ACUTE ONLY): 15 min  Charges:  $Gait Training: 8-22 mins                     Kenyon Ana, PT  Pager: [phone] Acute Rehab Dept [facility]):  [phone]   10/12/2018    [facility] 10/12/2018, 11:43 AM

## 2018-10-12 NOTE — Evaluation (Signed)
Occupational Therapy Evaluation Patient Details Name: James Strong MRN: [id number] DOB: April 12, 1923 Today's Date: 10/12/2018    History of Present Illness James Strong is a 85 y.o. male with medical history significant for chronic A. fib not on oral anticoagulation, coronary artery disease, hypertension, hyperlipidemia, ambulatory dysfunction with rolling walker at baseline, who presented to [facility] ED after fall in house while ambulating with RW.   Clinical Impression   Pt was admitted for the above. At baseline, he is mod I for basic adls and stays alone during the day when his son works. He currently needs min guard. Will follow in acute setting with supervision level goals.    Follow Up Recommendations  Home health OT ; supervision for mobility/adls   Equipment Recommendations  None recommended by OT    Recommendations for Other Services       Precautions / Restrictions Precautions Precautions: Fall Restrictions Weight Bearing Restrictions: No      Mobility Bed Mobility         Supine to sit: Supervision Sit to supine: Supervision      Transfers   Equipment used: Rolling walker (2 wheeled)   Sit to Stand: Min guard              Balance                                           ADL either performed or assessed with clinical judgement   ADL Overall ADL's : Needs assistance/impaired                                       General ADL Comments: pt needs set up and min guard for balance during adls and transfers.       Vision         Perception     Praxis      Pertinent Vitals/Pain Pain Assessment: No/denies pain     Hand Dominance Right   Extremity/Trunk Assessment Upper Extremity Assessment Upper Extremity Assessment: Generalized weakness           Communication Communication Communication: HOH   Cognition Arousal/Alertness: Awake/alert Behavior During Therapy: WFL for tasks  assessed/performed Overall Cognitive Status: Within Functional Limits for tasks assessed                                     General Comments       Exercises     Shoulder Instructions      Home Living Family/patient expects to be discharged to:: Private residence Living Arrangements: Children Available Help at Discharge: Family                   Bathroom Toilet: Standard     Home Equipment: Bedside commode;Walker - 2 wheels;Walker - 4 wheels;Cane - single point;Shower seat;Grab bars - tub/shower   Additional Comments: pt does not use the shower and instead takes sponge bathes as he is fearful of falling in shower      Prior Functioning/Environment Level of Independence: Independent with assistive device(s)                 OT Problem List: Decreased strength;Decreased activity tolerance;Impaired balance (sitting and/or standing)  OT Treatment/Interventions: Self-care/ADL training;DME and/or AE instruction;Energy conservation;Patient/family education;Balance training;Therapeutic activities    OT Goals(Current goals can be found in the care plan section) Acute Rehab OT Goals Patient Stated Goal: pt wants to return home OT Goal Formulation: With patient Time For Goal Achievement: 10/26/18 Potential to Achieve Goals: Good ADL Goals Pt Will Transfer to Toilet: with supervision;bedside commode;stand pivot transfer;ambulating Pt Will Perform Toileting - Clothing Manipulation and hygiene: with supervision;sitting/lateral leans;sit to/from stand Additional ADL Goal #1: pt will perform adl with set up/supervision  OT Frequency: Min 2X/week   Barriers to D/C:            Co-evaluation              AM-PAC OT "6 Clicks" Daily Activity     Outcome Measure Help from another person eating meals?: None Help from another person taking care of personal grooming?: A Little Help from another person toileting, which includes using toliet, bedpan,  or urinal?: A Little Help from another person bathing (including washing, rinsing, drying)?: A Little Help from another person to put on and taking off regular upper body clothing?: A Little Help from another person to put on and taking off regular lower body clothing?: A Little 6 Click Score: 19   End of Session    Activity Tolerance: Patient tolerated treatment well Patient left: in bed;with call bell/phone within reach;with bed alarm set  OT Visit Diagnosis: Muscle weakness (generalized) (M62.81)                Time: 1898-4210 OT Time Calculation (min): 11 min Charges:  OT General Charges $OT Visit: 1 Visit OT Evaluation $OT Eval Low Complexity: [place], OTR/L Acute Rehabilitation Services [phone] [facility] pager [phone] office 10/12/2018  [place] 10/12/2018, 4:04 PM

## 2019-01-07 DIAGNOSIS — E43 Unspecified severe protein-calorie malnutrition: Secondary | ICD-10-CM | POA: Diagnosis not present

## 2019-01-07 DIAGNOSIS — Z0000 Encounter for general adult medical examination without abnormal findings: Secondary | ICD-10-CM | POA: Diagnosis not present

## 2019-01-07 DIAGNOSIS — C439 Malignant melanoma of skin, unspecified: Secondary | ICD-10-CM | POA: Diagnosis not present

## 2019-01-07 DIAGNOSIS — I482 Chronic atrial fibrillation, unspecified: Secondary | ICD-10-CM | POA: Diagnosis not present

## 2019-01-08 ENCOUNTER — Emergency Department (HOSPITAL_COMMUNITY)
Admission: EM | Admit: 2019-01-08 | Discharge: 2019-01-08 | Disposition: A | Discharge status: Home / Self Care | Payer: Medicare Other | Attending: Emergency Medicine | Admitting: Emergency Medicine

## 2019-01-08 ENCOUNTER — Other Ambulatory Visit: Payer: Self-pay

## 2019-01-08 ENCOUNTER — Encounter (HOSPITAL_COMMUNITY): Payer: Self-pay

## 2019-01-08 DIAGNOSIS — Z79899 Other long term (current) drug therapy: Secondary | ICD-10-CM | POA: Insufficient documentation

## 2019-01-08 DIAGNOSIS — N189 Chronic kidney disease, unspecified: Secondary | ICD-10-CM | POA: Diagnosis not present

## 2019-01-08 DIAGNOSIS — Z7982 Long term (current) use of aspirin: Secondary | ICD-10-CM | POA: Insufficient documentation

## 2019-01-08 DIAGNOSIS — N3000 Acute cystitis without hematuria: Secondary | ICD-10-CM | POA: Insufficient documentation

## 2019-01-08 DIAGNOSIS — I129 Hypertensive chronic kidney disease with stage 1 through stage 4 chronic kidney disease, or unspecified chronic kidney disease: Secondary | ICD-10-CM | POA: Diagnosis not present

## 2019-01-08 DIAGNOSIS — R799 Abnormal finding of blood chemistry, unspecified: Secondary | ICD-10-CM | POA: Diagnosis present

## 2019-01-08 DIAGNOSIS — Z87891 Personal history of nicotine dependence: Secondary | ICD-10-CM | POA: Diagnosis not present

## 2019-01-08 DIAGNOSIS — N179 Acute kidney failure, unspecified: Secondary | ICD-10-CM

## 2019-01-08 LAB — CBC WITH DIFFERENTIAL/PLATELET
Abs Immature Granulocytes: 0.01 10*3/uL (ref 0.00–0.07)
Basophils Absolute: 0 10*3/uL (ref 0.0–0.1)
Basophils Relative: 1 %
Eosinophils Absolute: 0.1 10*3/uL (ref 0.0–0.5)
Eosinophils Relative: 3 %
HCT: 31.7 % — ABNORMAL LOW (ref 39.0–52.0)
Hemoglobin: 9.7 g/dL — ABNORMAL LOW (ref 13.0–17.0)
Immature Granulocytes: 0 %
Lymphocytes Relative: 18 %
Lymphs Abs: 0.9 10*3/uL (ref 0.7–4.0)
MCH: 34 pg (ref 26.0–34.0)
MCHC: 30.6 g/dL (ref 30.0–36.0)
MCV: 111.2 fL — ABNORMAL HIGH (ref 80.0–100.0)
Monocytes Absolute: 0.7 10*3/uL (ref 0.1–1.0)
Monocytes Relative: 14 %
Neutro Abs: 3.1 10*3/uL (ref 1.7–7.7)
Neutrophils Relative %: 64 %
Platelets: 120 10*3/uL — ABNORMAL LOW (ref 150–400)
RBC: 2.85 MIL/uL — ABNORMAL LOW (ref 4.22–5.81)
RDW: 16.4 % — ABNORMAL HIGH (ref 11.5–15.5)
WBC: 4.8 10*3/uL (ref 4.0–10.5)
nRBC: 0 % (ref 0.0–0.2)

## 2019-01-08 LAB — BASIC METABOLIC PANEL
Anion gap: 10 (ref 5–15)
Anion gap: 7 (ref 5–15)
BUN: 49 mg/dL — ABNORMAL HIGH (ref 8–23)
BUN: 45 mg/dL — ABNORMAL HIGH (ref 8–23)
CO2: 15 mmol/L — ABNORMAL LOW (ref 22–32)
CO2: 15 mmol/L — ABNORMAL LOW (ref 22–32)
Calcium: 7.1 mg/dL — ABNORMAL LOW (ref 8.9–10.3)
Calcium: 6.6 mg/dL — ABNORMAL LOW (ref 8.9–10.3)
Chloride: 119 mmol/L — ABNORMAL HIGH (ref 98–111)
Chloride: 120 mmol/L — ABNORMAL HIGH (ref 98–111)
Creatinine, Ser: 3.3 mg/dL — ABNORMAL HIGH (ref 0.61–1.24)
Creatinine, Ser: 3.07 mg/dL — ABNORMAL HIGH (ref 0.61–1.24)
GFR calc Af Amer: 18 mL/min — ABNORMAL LOW (ref >60)
GFR calc Af Amer: 19 mL/min — ABNORMAL LOW (ref >60)
GFR calc non Af Amer: 15 mL/min — ABNORMAL LOW (ref >60)
GFR calc non Af Amer: 17 mL/min — ABNORMAL LOW (ref >60)
Glucose, Bld: 85 mg/dL (ref 70–99)
Glucose, Bld: 109 mg/dL — ABNORMAL HIGH (ref 70–99)
Potassium: 4.3 mmol/L (ref 3.5–5.1)
Potassium: 4 mmol/L (ref 3.5–5.1)
Sodium: 144 mmol/L (ref 135–145)
Sodium: 142 mmol/L (ref 135–145)

## 2019-01-08 LAB — URINALYSIS, ROUTINE W REFLEX MICROSCOPIC
Bilirubin Urine: NEGATIVE
Glucose, UA: NEGATIVE mg/dL
Ketones, ur: NEGATIVE mg/dL
Nitrite: NEGATIVE
Protein, ur: 100 mg/dL — AB
Specific Gravity, Urine: 1.011 (ref 1.005–1.030)
WBC, UA: >50 WBC/hpf — ABNORMAL HIGH (ref 0–5)
pH: 5 (ref 5.0–8.0)

## 2019-01-08 MED ORDER — SODIUM CHLORIDE 0.9 % IV BOLUS
500.0000 mL | Freq: Once | INTRAVENOUS | Status: AC
  Administered 2019-01-08: 500 mL via INTRAVENOUS

## 2019-01-08 MED ORDER — SODIUM CHLORIDE 0.9 % IV BOLUS (SEPSIS)
500.0000 mL | Freq: Once | INTRAVENOUS | Status: AC
  Administered 2019-01-08: 500 mL via INTRAVENOUS

## 2019-01-08 MED ORDER — SODIUM CHLORIDE 0.9 % IV SOLN
1.0000 g | Freq: Once | INTRAVENOUS | Status: AC
  Administered 2019-01-08: 1 g via INTRAVENOUS
  Filled 2019-01-08: qty 10

## 2019-01-08 MED ORDER — CEPHALEXIN 250 MG PO CAPS: 250.0000 mg | ORAL_CAPSULE | Freq: Every day | ORAL | 0 refills | Status: AC

## 2019-01-08 MED ORDER — SODIUM CHLORIDE 0.9 % IV SOLN
1000.0000 mL | INTRAVENOUS | Status: DC
  Administered 2019-01-08: 1000 mL via INTRAVENOUS

## 2019-01-08 NOTE — ED Triage Notes (Signed)
Patient was sent from PCP office today for elevated BUN. Patient has had intermittent diarrhea x 3-4 days.

## 2019-01-08 NOTE — Discharge Instructions (Signed)
Make sure to drink plenty of fluids.  Take the antibiotics as prescribed.  Return to the emergency room if you change your mind about being admitted or start feeling worse.

## 2019-01-08 NOTE — ED Provider Notes (Signed)
[facility] DEPT Provider Note   CSN: [id number] Arrival date & time: 01/08/19  1055     History   Chief Complaint Chief Complaint  Patient presents with  . Abnormal Lab  . Diarrhea    HPI James Strong is a 85 y.o. male.     HPI Patient presents to the ED for evaluation of abnormal lab results.  Patient is here with his family member.  Initially the patient denied having any diarrhea recently but she indicated that he was having episodes over the weekend.  That does seem to have decreased.  Patient denies having any trouble with fevers or chills.  He is not having any abdominal pain.  He went to his doctor yesterday and had laboratory tests.  Patient was told that it showed he was dehydrated need to come to the ED for IV fluids.  Patient states he is feeling fine now.  No weakness.  No syncope.  No chest pain or shortness of breath.  Records do indicate the patient has history of chronic kidney disease as well as acute kidney injury associated with dehydration in the past. Past Medical History:  Diagnosis Date  . Anemia   . Benign hypertensive heart disease without heart failure   . Chronic anticoagulation   . CKD (chronic kidney disease)   . Colon polyp   . Coronary atherosclerosis of native coronary artery    nonobstructive ASCAD - 30-40% mid LAD and 30% mid PL by cath 2006  . Hyperlipidemia   . Iron deficiency   . Melanoma of skin, site unspecified   . Osteoarthrosis   . PAF (paroxysmal atrial fibrillation) (HCC)    on warfarin for CHADS2VASC score of 4  . Prediabetes     Patient Active Problem List   Diagnosis Date Noted  . UTI due to Klebsiella species 10/12/2018  . Syncope 10/10/2018  . RBBB (right bundle branch block with left anterior fascicular block) 12/13/2012  . Persistent atrial fibrillation ([place]) 12/13/2012  . Chronic anticoagulation   . CAD (coronary artery disease)   . Prediabetes   . Hyperlipidemia   . Melanoma of  skin, site unspecified   . Anemia   . Iron deficiency   . Osteoarthrosis   . CKD (chronic kidney disease)   . HTN (hypertension)   . Colon polyp     Past Surgical History:  Procedure Laterality Date  . DENTAL SURGERY    . MELANOMA EXCISION     superficial on scalp  . skin melonoma removed from scalp          Home Medications    Prior to Admission medications   Medication Sig Start Date End Date Taking? Authorizing Provider  aspirin EC 81 MG tablet Take 81 mg by mouth daily.   Yes [provider]  diltiazem (TIAZAC) 120 MG 24 hr capsule Take 120 mg by mouth daily.   Yes [provider]  metoprolol (LOPRESSOR) 50 MG tablet Take 50 mg by mouth 2 (two) times daily.   Yes [provider]  mirtazapine (REMERON) 15 MG tablet Take 15 mg by mouth at bedtime.   Yes [provider]  simvastatin (ZOCOR) 20 MG tablet Take 10 mg by mouth at bedtime.  11/09/12  Yes [provider]  cephALEXin (KEFLEX) 250 MG capsule Take 1 capsule (250 mg total) by mouth daily for 5 doses. 01/08/19 01/13/19  Dorie Rank, MD    Family History Family History  Problem Relation Age of  Onset  . Dementia Father     Social History Social History   Tobacco Use  . Smoking status: Former Smoker    Quit date: 12/12/1969    Years since quitting: 49.1  . Smokeless tobacco: Never Used  Substance Use Topics  . Alcohol use: Not Currently  . Drug use: Never     Allergies   Patient has no known allergies.   Review of Systems Review of Systems  All other systems reviewed and are negative.    Physical Exam Updated Vital Signs BP 111/71   Pulse 97   Resp 18   Ht 1.676 m (5\' 6" )   Wt 51.7 kg   SpO2 100%   BMI 18.40 kg/m   Physical Exam Vitals signs and nursing note reviewed.  Constitutional:      General: He is not in acute distress.    Comments: Elderly, frail, smiling  HENT:     Head: Normocephalic and atraumatic.     Right Ear: External ear  normal.     Left Ear: External ear normal.  Eyes:     General: No scleral icterus.       Right eye: No discharge.        Left eye: No discharge.     Conjunctiva/sclera: Conjunctivae normal.  Neck:     Musculoskeletal: Neck supple.     Trachea: No tracheal deviation.  Cardiovascular:     Rate and Rhythm: Normal rate and regular rhythm.  Pulmonary:     Effort: Pulmonary effort is normal. No respiratory distress.     Breath sounds: Normal breath sounds. No stridor. No wheezing or rales.  Abdominal:     General: Bowel sounds are normal. There is no distension.     Palpations: Abdomen is soft.     Tenderness: There is no abdominal tenderness. There is no guarding or rebound.  Musculoskeletal:        General: No tenderness.  Skin:    General: Skin is warm and dry.     Findings: No rash.  Neurological:     Mental Status: He is alert.     Cranial Nerves: No cranial nerve deficit (no facial droop, extraocular movements intact, no slurred speech).     Sensory: No sensory deficit.     Motor: No abnormal muscle tone or seizure activity.     Coordination: Coordination normal.      ED Treatments / Results  Labs (all labs ordered are listed, but only abnormal results are displayed) Labs Reviewed  CBC WITH DIFFERENTIAL/PLATELET - Abnormal; Notable for the following components:      Result Value   RBC 2.85 (*)    Hemoglobin 9.7 (*)    HCT 31.7 (*)    MCV 111.2 (*)    RDW 16.4 (*)    Platelets 120 (*)    All other components within normal limits  BASIC METABOLIC PANEL - Abnormal; Notable for the following components:   Chloride 119 (*)    CO2 15 (*)    BUN 49 (*)    Creatinine, Ser 3.30 (*)    Calcium 7.1 (*)    GFR calc non Af Amer 15 (*)    GFR calc Af Amer 18 (*)    All other components within normal limits  URINALYSIS, ROUTINE W REFLEX MICROSCOPIC - Abnormal; Notable for the following components:   APPearance TURBID (*)    Hgb urine dipstick MODERATE (*)    Protein, ur  100 (*)  Leukocytes,Ua LARGE (*)    WBC, UA >50 (*)    Bacteria, UA MANY (*)    All other components within normal limits  BASIC METABOLIC PANEL - Abnormal; Notable for the following components:   Chloride 120 (*)    CO2 15 (*)    Glucose, Bld 109 (*)    BUN 45 (*)    Creatinine, Ser 3.07 (*)    Calcium 6.6 (*)    GFR calc non Af Amer 17 (*)    GFR calc Af Amer 19 (*)    All other components within normal limits  URINE CULTURE    Procedures Procedures (including critical care time)  Medications Ordered in ED Medications  sodium chloride 0.9 % bolus 500 mL (0 mLs Intravenous Stopped 01/08/19 1324)    Followed by  0.9 %  sodium chloride infusion (1,000 mLs Intravenous New Bag/Given 01/08/19 1351)  sodium chloride 0.9 % bolus 500 mL (0 mLs Intravenous Stopped 01/08/19 1351)  cefTRIAXone (ROCEPHIN) 1 g in sodium chloride 0.9 % 100 mL IVPB (0 g Intravenous Stopped 01/08/19 1512)     Initial Impression / Assessment and Plan / ED Course  I have reviewed the triage vital signs and the nursing notes.  Pertinent labs & imaging results that were available during my care of the patient were reviewed by me and considered in my medical decision making (see chart for details).  Clinical Course as of Jan 08 1639  Tue Jan 08, 2019  1241 Anemia is stable   [JK]  1242 Bicarb is decreased and creatinine elevated compared to previous.   [WV]  3710 Findings discussed with patient and his daughter.  I recommend admission for IV fluid and further evaluation.  Daughter agrees with admission but the patient is adamant he does not want to stay in the hospital.  He is refusing admission right now.  Patient's daughter asked if we could at least try giving him more IV fluids and rechecking.   [JK]  1410 Urinalysis is abnormal.  Suggest possible UTI.   [JK]  1633 Electrolyte panel rechecked.  Patient still has persistent AKI with metabolic acidosis.  Creatinine is only slightly improved.  I once  again had discussion with the patient and his daughter.  He still is adamant that he does not want to be admitted to the hospital.  Patient understands that if he gets worse it is possible that he could die from this illness but he would still rather go home.  Pt agrees to drink plenty of fluids.  I will give him an Rx for uti   [JK]    Clinical Course User Index [JK] Dorie Rank, MD     Patient sent to the ED for evaluation of dehydration.  Patient has significant electrolyte abnormalities.  He has acute on chronic kidney injury and metabolic acidosis.  Patient's urinalysis also suggest urinary tract infection.  Patient did receive a liter of IV ED.  I recommended admission to the hospital.  Patient did not want to be admitted he wanted to try rechecking the labs after IV hydration.  This was done but he still has persistent kidney injury.  Patient does understand that his symptoms can get worse and could be potentially life-threatening.  He would rather go home and face this rest then be admitted to the hospital.  Patient is here with his daughter.  She would like him to be admitted as well but the patient is certainly capable of making his own decisions.  I will give him a prescription for antibiotics.  I discussed oral rehydration at home.  Discussed outpatient follow-up.  Patient could return at anytime if he changes his mind.  Final Clinical Impressions(s) / ED Diagnoses   Final diagnoses:  Acute cystitis without hematuria  AKI (acute kidney injury) [facility])    ED Discharge Orders         Ordered    cephALEXin (KEFLEX) 250 MG capsule  Daily     01/08/19 1638           Dorie Rank, MD 01/08/19 1640

## 2019-01-10 LAB — URINE CULTURE: Culture: >=100000 — AB

## 2019-01-11 ENCOUNTER — Telehealth: Payer: Self-pay | Admitting: *Deleted

## 2019-01-11 NOTE — Telephone Encounter (Signed)
Post ED Visit - Positive Culture Follow-up  Culture report reviewed by antimicrobial stewardship pharmacist: [facility] Team []  Elenor Quinones, Pharm.D. []  Heide Guile, Pharm.D., BCPS AQ-ID []  Parks Neptune, Pharm.D., BCPS []  Alycia Rossetti, Pharm.D., BCPS []  [place], Pharm.D., BCPS, AAHIVP []  Legrand Como, Pharm.D., BCPS, AAHIVP []  Salome Arnt, PharmD, BCPS []  Johnnette Gourd, PharmD, BCPS []  Hughes Better, PharmD, BCPS []  Leeroy Cha, PharmD []  Laqueta Linden, PharmD, BCPS []  Albertina Parr, PharmD  [place] Team [x]  Leodis Sias, PharmD []  Lindell Spar, PharmD []  Royetta Asal, PharmD []  Graylin Shiver, Rph []  Rema Fendt) Glennon Mac, PharmD []  Arlyn Dunning, PharmD []  Netta Cedars, PharmD []  Dia Sitter, PharmD []  Leone Haven, PharmD []  Gretta Arab, PharmD []  Theodis Shove, PharmD []  Peggyann Juba, PharmD []  Reuel Boom, PharmD   Positive urine culture Treated with Cephalexin, organism sensitive to the same and no further patient follow-up is required at this time.  Harlon Flor Talley 01/11/2019, 2:10 PM

## 2019-01-28 ENCOUNTER — Inpatient Hospital Stay (HOSPITAL_COMMUNITY)
Admission: EM | Admit: 2019-01-28 | Discharge: 2019-02-22 | DRG: 871 | Disposition: E | Payer: Medicare Other | Attending: Family Medicine | Admitting: Family Medicine

## 2019-01-28 ENCOUNTER — Encounter (HOSPITAL_COMMUNITY): Payer: Self-pay | Admitting: Emergency Medicine

## 2019-01-28 ENCOUNTER — Other Ambulatory Visit: Payer: Self-pay

## 2019-01-28 ENCOUNTER — Emergency Department (HOSPITAL_COMMUNITY): Payer: Medicare Other

## 2019-01-28 DIAGNOSIS — D649 Anemia, unspecified: Secondary | ICD-10-CM | POA: Diagnosis not present

## 2019-01-28 DIAGNOSIS — N179 Acute kidney failure, unspecified: Secondary | ICD-10-CM

## 2019-01-28 DIAGNOSIS — Z9181 History of falling: Secondary | ICD-10-CM

## 2019-01-28 DIAGNOSIS — Z781 Physical restraint status: Secondary | ICD-10-CM

## 2019-01-28 DIAGNOSIS — R000 Tachycardia, unspecified: Secondary | ICD-10-CM | POA: Diagnosis present

## 2019-01-28 DIAGNOSIS — Z515 Encounter for palliative care: Secondary | ICD-10-CM | POA: Diagnosis present

## 2019-01-28 DIAGNOSIS — R739 Hyperglycemia, unspecified: Secondary | ICD-10-CM | POA: Diagnosis not present

## 2019-01-28 DIAGNOSIS — R7303 Prediabetes: Secondary | ICD-10-CM | POA: Diagnosis present

## 2019-01-28 DIAGNOSIS — E43 Unspecified severe protein-calorie malnutrition: Secondary | ICD-10-CM

## 2019-01-28 DIAGNOSIS — J9601 Acute respiratory failure with hypoxia: Secondary | ICD-10-CM | POA: Diagnosis not present

## 2019-01-28 DIAGNOSIS — I10 Essential (primary) hypertension: Secondary | ICD-10-CM | POA: Diagnosis present

## 2019-01-28 DIAGNOSIS — I2510 Atherosclerotic heart disease of native coronary artery without angina pectoris: Secondary | ICD-10-CM | POA: Diagnosis present

## 2019-01-28 DIAGNOSIS — E785 Hyperlipidemia, unspecified: Secondary | ICD-10-CM | POA: Diagnosis present

## 2019-01-28 DIAGNOSIS — A419 Sepsis, unspecified organism: Secondary | ICD-10-CM | POA: Diagnosis not present

## 2019-01-28 DIAGNOSIS — M542 Cervicalgia: Secondary | ICD-10-CM | POA: Diagnosis not present

## 2019-01-28 DIAGNOSIS — R0902 Hypoxemia: Secondary | ICD-10-CM

## 2019-01-28 DIAGNOSIS — D531 Other megaloblastic anemias, not elsewhere classified: Secondary | ICD-10-CM | POA: Diagnosis present

## 2019-01-28 DIAGNOSIS — Z85820 Personal history of malignant melanoma of skin: Secondary | ICD-10-CM

## 2019-01-28 DIAGNOSIS — D638 Anemia in other chronic diseases classified elsewhere: Secondary | ICD-10-CM | POA: Diagnosis present

## 2019-01-28 DIAGNOSIS — Z66 Do not resuscitate: Secondary | ICD-10-CM | POA: Diagnosis present

## 2019-01-28 DIAGNOSIS — R6520 Severe sepsis without septic shock: Secondary | ICD-10-CM | POA: Diagnosis present

## 2019-01-28 DIAGNOSIS — Z7982 Long term (current) use of aspirin: Secondary | ICD-10-CM

## 2019-01-28 DIAGNOSIS — E872 Acidosis, unspecified: Secondary | ICD-10-CM | POA: Diagnosis present

## 2019-01-28 DIAGNOSIS — Z23 Encounter for immunization: Secondary | ICD-10-CM

## 2019-01-28 DIAGNOSIS — U071 COVID-19: Secondary | ICD-10-CM | POA: Diagnosis not present

## 2019-01-28 DIAGNOSIS — I4819 Other persistent atrial fibrillation: Secondary | ICD-10-CM | POA: Diagnosis present

## 2019-01-28 DIAGNOSIS — I4510 Unspecified right bundle-branch block: Secondary | ICD-10-CM | POA: Diagnosis present

## 2019-01-28 DIAGNOSIS — I130 Hypertensive heart and chronic kidney disease with heart failure and stage 1 through stage 4 chronic kidney disease, or unspecified chronic kidney disease: Secondary | ICD-10-CM | POA: Diagnosis present

## 2019-01-28 DIAGNOSIS — M1990 Unspecified osteoarthritis, unspecified site: Secondary | ICD-10-CM | POA: Diagnosis present

## 2019-01-28 DIAGNOSIS — N189 Chronic kidney disease, unspecified: Secondary | ICD-10-CM | POA: Diagnosis not present

## 2019-01-28 DIAGNOSIS — N184 Chronic kidney disease, stage 4 (severe): Secondary | ICD-10-CM | POA: Diagnosis present

## 2019-01-28 DIAGNOSIS — G9341 Metabolic encephalopathy: Secondary | ICD-10-CM | POA: Diagnosis present

## 2019-01-28 DIAGNOSIS — R05 Cough: Secondary | ICD-10-CM | POA: Diagnosis not present

## 2019-01-28 DIAGNOSIS — G8929 Other chronic pain: Secondary | ICD-10-CM | POA: Diagnosis present

## 2019-01-28 DIAGNOSIS — Z9981 Dependence on supplemental oxygen: Secondary | ICD-10-CM

## 2019-01-28 DIAGNOSIS — Z820 Family history of epilepsy and other diseases of the nervous system: Secondary | ICD-10-CM

## 2019-01-28 DIAGNOSIS — Z681 Body mass index (BMI) 19 or less, adult: Secondary | ICD-10-CM

## 2019-01-28 DIAGNOSIS — Z87891 Personal history of nicotine dependence: Secondary | ICD-10-CM

## 2019-01-28 DIAGNOSIS — E611 Iron deficiency: Secondary | ICD-10-CM | POA: Diagnosis present

## 2019-01-28 DIAGNOSIS — R451 Restlessness and agitation: Secondary | ICD-10-CM | POA: Diagnosis not present

## 2019-01-28 DIAGNOSIS — J1289 Other viral pneumonia: Secondary | ICD-10-CM | POA: Diagnosis not present

## 2019-01-28 DIAGNOSIS — Z79899 Other long term (current) drug therapy: Secondary | ICD-10-CM

## 2019-01-28 DIAGNOSIS — I5033 Acute on chronic diastolic (congestive) heart failure: Secondary | ICD-10-CM | POA: Diagnosis not present

## 2019-01-28 DIAGNOSIS — A4189 Other specified sepsis: Principal | ICD-10-CM | POA: Diagnosis present

## 2019-01-28 DIAGNOSIS — E538 Deficiency of other specified B group vitamins: Secondary | ICD-10-CM | POA: Diagnosis present

## 2019-01-28 DIAGNOSIS — E7800 Pure hypercholesterolemia, unspecified: Secondary | ICD-10-CM | POA: Diagnosis not present

## 2019-01-28 DIAGNOSIS — R52 Pain, unspecified: Secondary | ICD-10-CM | POA: Diagnosis not present

## 2019-01-28 DIAGNOSIS — I959 Hypotension, unspecified: Secondary | ICD-10-CM | POA: Diagnosis not present

## 2019-01-28 DIAGNOSIS — Z8719 Personal history of other diseases of the digestive system: Secondary | ICD-10-CM

## 2019-01-28 DIAGNOSIS — D519 Vitamin B12 deficiency anemia, unspecified: Secondary | ICD-10-CM | POA: Diagnosis present

## 2019-01-28 DIAGNOSIS — J1282 Pneumonia due to coronavirus disease 2019: Secondary | ICD-10-CM | POA: Diagnosis present

## 2019-01-28 DIAGNOSIS — I4891 Unspecified atrial fibrillation: Secondary | ICD-10-CM

## 2019-01-28 DIAGNOSIS — T380X5A Adverse effect of glucocorticoids and synthetic analogues, initial encounter: Secondary | ICD-10-CM | POA: Diagnosis not present

## 2019-01-28 LAB — PROTIME-INR
INR: 1.3 — ABNORMAL HIGH (ref 0.8–1.2)
Prothrombin Time: 15.7 seconds — ABNORMAL HIGH (ref 11.4–15.2)

## 2019-01-28 LAB — CBC WITH DIFFERENTIAL/PLATELET
Abs Immature Granulocytes: 0.13 10*3/uL — ABNORMAL HIGH (ref 0.00–0.07)
Basophils Absolute: 0 10*3/uL (ref 0.0–0.1)
Basophils Relative: 0 %
Eosinophils Absolute: 0 10*3/uL (ref 0.0–0.5)
Eosinophils Relative: 0 %
HCT: 32.7 % — ABNORMAL LOW (ref 39.0–52.0)
Hemoglobin: 9.8 g/dL — ABNORMAL LOW (ref 13.0–17.0)
Immature Granulocytes: 1 %
Lymphocytes Relative: 11 %
Lymphs Abs: 1.8 10*3/uL (ref 0.7–4.0)
MCH: 34.1 pg — ABNORMAL HIGH (ref 26.0–34.0)
MCHC: 30 g/dL (ref 30.0–36.0)
MCV: 113.9 fL — ABNORMAL HIGH (ref 80.0–100.0)
Monocytes Absolute: 0.9 10*3/uL (ref 0.1–1.0)
Monocytes Relative: 5 %
Neutro Abs: 13.6 10*3/uL — ABNORMAL HIGH (ref 1.7–7.7)
Neutrophils Relative %: 83 %
Platelets: 238 10*3/uL (ref 150–400)
RBC: 2.87 MIL/uL — ABNORMAL LOW (ref 4.22–5.81)
RDW: 15.7 % — ABNORMAL HIGH (ref 11.5–15.5)
WBC: 16.6 10*3/uL — ABNORMAL HIGH (ref 4.0–10.5)
nRBC: 0 % (ref 0.0–0.2)

## 2019-01-28 LAB — COMPREHENSIVE METABOLIC PANEL
ALT: 11 U/L (ref 0–44)
AST: 20 U/L (ref 15–41)
Albumin: 2.6 g/dL — ABNORMAL LOW (ref 3.5–5.0)
Alkaline Phosphatase: 86 U/L (ref 38–126)
Anion gap: 16 — ABNORMAL HIGH (ref 5–15)
BUN: 63 mg/dL — ABNORMAL HIGH (ref 8–23)
CO2: 11 mmol/L — ABNORMAL LOW (ref 22–32)
Calcium: 7.8 mg/dL — ABNORMAL LOW (ref 8.9–10.3)
Chloride: 114 mmol/L — ABNORMAL HIGH (ref 98–111)
Creatinine, Ser: 3.72 mg/dL — ABNORMAL HIGH (ref 0.61–1.24)
GFR calc Af Amer: 15 mL/min — ABNORMAL LOW (ref >60)
GFR calc non Af Amer: 13 mL/min — ABNORMAL LOW (ref >60)
Glucose, Bld: 125 mg/dL — ABNORMAL HIGH (ref 70–99)
Potassium: 4.3 mmol/L (ref 3.5–5.1)
Sodium: 141 mmol/L (ref 135–145)
Total Bilirubin: 1 mg/dL (ref 0.3–1.2)
Total Protein: 6.6 g/dL (ref 6.5–8.1)

## 2019-01-28 LAB — LACTIC ACID, PLASMA
Lactic Acid, Venous: 2.7 mmol/L (ref 0.5–1.9)
Lactic Acid, Venous: 1.6 mmol/L (ref 0.5–1.9)

## 2019-01-28 LAB — SODIUM, URINE, RANDOM: Sodium, Ur: 17 mmol/L

## 2019-01-28 LAB — FERRITIN: Ferritin: 709 ng/mL — ABNORMAL HIGH (ref 24–336)

## 2019-01-28 LAB — TRIGLYCERIDES: Triglycerides: 62 mg/dL (ref <150)

## 2019-01-28 LAB — ABO/RH: ABO/RH(D): O POS

## 2019-01-28 LAB — POC SARS CORONAVIRUS 2 AG -  ED: SARS Coronavirus 2 Ag: POSITIVE — AB

## 2019-01-28 LAB — PROCALCITONIN
Procalcitonin: 0.79 ng/mL
Procalcitonin: 0.82 ng/mL

## 2019-01-28 LAB — CREATININE, URINE, RANDOM: Creatinine, Urine: 76.36 mg/dL

## 2019-01-28 LAB — D-DIMER, QUANTITATIVE: D-Dimer, Quant: 3.44 ug/mL-FEU — ABNORMAL HIGH (ref 0.00–0.50)

## 2019-01-28 LAB — FIBRINOGEN: Fibrinogen: 469 mg/dL (ref 210–475)

## 2019-01-28 LAB — C-REACTIVE PROTEIN: CRP: 16.7 mg/dL — ABNORMAL HIGH (ref <1.0)

## 2019-01-28 LAB — LACTATE DEHYDROGENASE: LDH: 260 U/L — ABNORMAL HIGH (ref 98–192)

## 2019-01-28 MED ORDER — SODIUM CHLORIDE 0.9 % IV SOLN
200.0000 mg | Freq: Once | INTRAVENOUS | Status: AC
  Administered 2019-01-28: 200 mg via INTRAVENOUS
  Filled 2019-01-28: qty 40

## 2019-01-28 MED ORDER — GUAIFENESIN-DM 100-10 MG/5ML PO SYRP
10.0000 mL | ORAL_SOLUTION | ORAL | Status: DC | PRN
  Administered 2019-01-29: 10 mL via ORAL
  Filled 2019-01-28: qty 10

## 2019-01-28 MED ORDER — FAMOTIDINE IN NACL 20-0.9 MG/50ML-% IV SOLN
20.0000 mg | Freq: Two times a day (BID) | INTRAVENOUS | Status: DC
  Administered 2019-01-28: 20 mg via INTRAVENOUS
  Filled 2019-01-28: qty 50

## 2019-01-28 MED ORDER — ASPIRIN EC 81 MG PO TBEC
81.0000 mg | DELAYED_RELEASE_TABLET | Freq: Every day | ORAL | Status: DC
  Administered 2019-01-28 – 2019-01-30 (×3): 81 mg via ORAL
  Filled 2019-01-28 (×3): qty 1

## 2019-01-28 MED ORDER — HEPARIN SODIUM (PORCINE) 5000 UNIT/ML IJ SOLN
5000.0000 [IU] | Freq: Three times a day (TID) | INTRAMUSCULAR | Status: DC
  Administered 2019-01-28 – 2019-01-30 (×6): 5000 [IU] via SUBCUTANEOUS
  Filled 2019-01-28 (×6): qty 1

## 2019-01-28 MED ORDER — MIRTAZAPINE 15 MG PO TABS
15.0000 mg | ORAL_TABLET | Freq: Every day | ORAL | Status: DC
  Administered 2019-01-28 – 2019-01-29 (×2): 15 mg via ORAL
  Filled 2019-01-28 (×2): qty 1

## 2019-01-28 MED ORDER — SODIUM CHLORIDE 0.9 % IV SOLN
Freq: Once | INTRAVENOUS | Status: AC
  Administered 2019-01-28: 17:00:00 via INTRAVENOUS

## 2019-01-28 MED ORDER — LIDOCAINE 5 % EX PTCH
1.0000 | MEDICATED_PATCH | CUTANEOUS | Status: DC
  Administered 2019-01-28 – 2019-01-30 (×3): 1 via TRANSDERMAL
  Filled 2019-01-28 (×5): qty 1

## 2019-01-28 MED ORDER — VITAMIN C 500 MG PO TABS
500.0000 mg | ORAL_TABLET | Freq: Every day | ORAL | Status: DC
  Administered 2019-01-28 – 2019-01-30 (×3): 500 mg via ORAL
  Filled 2019-01-28 (×3): qty 1

## 2019-01-28 MED ORDER — METOPROLOL TARTRATE 50 MG PO TABS
50.0000 mg | ORAL_TABLET | Freq: Two times a day (BID) | ORAL | Status: DC
  Administered 2019-01-28 – 2019-01-30 (×4): 50 mg via ORAL
  Filled 2019-01-28: qty 1
  Filled 2019-01-28: qty 2
  Filled 2019-01-28 (×2): qty 1

## 2019-01-28 MED ORDER — ZINC SULFATE 220 (50 ZN) MG PO CAPS
220.0000 mg | ORAL_CAPSULE | Freq: Every day | ORAL | Status: DC
  Administered 2019-01-28 – 2019-01-30 (×3): 220 mg via ORAL
  Filled 2019-01-28 (×3): qty 1

## 2019-01-28 MED ORDER — DEXAMETHASONE SODIUM PHOSPHATE 10 MG/ML IJ SOLN
6.0000 mg | INTRAMUSCULAR | Status: DC
  Administered 2019-01-28: 6 mg via INTRAVENOUS
  Filled 2019-01-28: qty 1

## 2019-01-28 MED ORDER — SODIUM CHLORIDE 0.9% FLUSH: 3.0000 mL | Freq: Once | INTRAVENOUS | Status: DC

## 2019-01-28 MED ORDER — DILTIAZEM HCL 100 MG IV SOLR
5.0000 mg/h | INTRAVENOUS | Status: DC
  Filled 2019-01-28: qty 100

## 2019-01-28 MED ORDER — ALBUTEROL SULFATE HFA 108 (90 BASE) MCG/ACT IN AERS
2.0000 | INHALATION_SPRAY | Freq: Four times a day (QID) | RESPIRATORY_TRACT | Status: DC
  Administered 2019-01-28 – 2019-01-30 (×8): 2 via RESPIRATORY_TRACT
  Filled 2019-01-28: qty 6.7

## 2019-01-28 MED ORDER — SODIUM CHLORIDE 0.9 % IV SOLN
100.0000 mg | Freq: Every day | INTRAVENOUS | Status: DC
  Administered 2019-01-29 – 2019-01-30 (×2): 100 mg via INTRAVENOUS
  Filled 2019-01-28 (×3): qty 20

## 2019-01-28 MED ORDER — DILTIAZEM HCL ER BEADS 120 MG PO CP24: 120.0000 mg | ORAL_CAPSULE | Freq: Every day | ORAL | Status: DC

## 2019-01-28 MED ORDER — DILTIAZEM HCL 25 MG/5ML IV SOLN
5.0000 mg | Freq: Once | INTRAVENOUS | Status: AC
  Administered 2019-01-28: 5 mg via INTRAVENOUS
  Filled 2019-01-28: qty 5

## 2019-01-28 MED ORDER — ACETAMINOPHEN 325 MG PO TABS: 650.0000 mg | ORAL_TABLET | Freq: Four times a day (QID) | ORAL | Status: DC | PRN

## 2019-01-28 MED ORDER — DILTIAZEM HCL-DEXTROSE 125-5 MG/125ML-% IV SOLN (PREMIX)
5.0000 mg/h | INTRAVENOUS | Status: DC
  Administered 2019-01-28 – 2019-01-29 (×2): 5 mg/h via INTRAVENOUS
  Filled 2019-01-28 (×4): qty 125

## 2019-01-28 MED ORDER — SODIUM CHLORIDE 0.9 % IV SOLN: 200.0000 mg | Freq: Once | INTRAVENOUS | Status: DC

## 2019-01-28 MED ORDER — HYDROCOD POLST-CPM POLST ER 10-8 MG/5ML PO SUER
5.0000 mL | Freq: Two times a day (BID) | ORAL | Status: DC | PRN
  Administered 2019-01-30: 5 mL via ORAL
  Filled 2019-01-28: qty 5

## 2019-01-28 MED ORDER — SIMVASTATIN 20 MG PO TABS
10.0000 mg | ORAL_TABLET | Freq: Every day | ORAL | Status: DC
  Administered 2019-01-28 – 2019-01-29 (×2): 10 mg via ORAL
  Filled 2019-01-28 (×2): qty 1

## 2019-01-28 NOTE — ED Notes (Signed)
Pt on 6L 02 per [place]

## 2019-01-28 NOTE — ED Notes (Signed)
I switched pt to 10L 02 via non-rebreather for [facility] transport

## 2019-01-28 NOTE — ED Notes (Signed)
Called [facility] for transport gvc . Stated it will be a while, and the pt needs to be put on a non rebreather

## 2019-01-28 NOTE — ED Triage Notes (Signed)
Pt arrives via ptar from home for c/o cough and neck pain, had recent covid exposure. Pts sats on room air 79%, improved to 98% on 4L O2, does not normally wear o2 at home, denies cp or sob.

## 2019-01-28 NOTE — ED Notes (Addendum)
Pt is A-fib w/RVR on monitor

## 2019-01-28 NOTE — ED Notes (Signed)
James Strong [phone] daughter/poa looking for an update on patient

## 2019-01-28 NOTE — ED Notes (Signed)
ED TO INPATIENT HANDOFF REPORT  ED Nurse Name and Phone #: Lorrin Goodell [phone]  S Name/Age/Gender James Strong 85 y.o. male Room/Bed: 012C/012C  Code Status   Code Status: DNR  Home/SNF/Other Home Patient oriented to: self Is this baseline? No   Triage Complete: Triage complete  Chief Complaint neck pain  Triage Note Pt arrives via ptar from home for c/o cough and neck pain, had recent covid exposure. Pts sats on room air 79%, improved to 98% on 4L O2, does not normally wear o2 at home, denies cp or sob.    Allergies No Known Allergies  Level of Care/Admitting Diagnosis ED Disposition    ED Disposition Condition [facility] Area: [place] [[id number]]  Level of Care: Telemetry [5]  Covid Evaluation: Confirmed COVID Positive  Diagnosis: Pneumonia due to COVID-19 virus [[id number]]  Admitting Physician: Norval Morton [[id number]]  Attending Physician: Norval Morton [[id number]]  Estimated length of stay: past midnight tomorrow  Certification:: I certify this patient will need inpatient services for at least 2 midnights  PT Class (Do Not Modify): Inpatient [101]  PT Acc Code (Do Not Modify): Private [1]       B Medical/Surgery History Past Medical History:  Diagnosis Date  . Anemia   . Benign hypertensive heart disease without heart failure   . Chronic anticoagulation   . CKD (chronic kidney disease)   . Colon polyp   . Coronary atherosclerosis of native coronary artery    nonobstructive ASCAD - 30-40% mid LAD and 30% mid PL by cath 2006  . Hyperlipidemia   . Iron deficiency   . Melanoma of skin, site unspecified   . Osteoarthrosis   . PAF (paroxysmal atrial fibrillation) (HCC)    on warfarin for CHADS2VASC score of 4  . Prediabetes    Past Surgical History:  Procedure Laterality Date  . DENTAL SURGERY    . MELANOMA EXCISION     superficial on scalp  . skin melonoma removed from scalp       A IV  Location/Drains/Wounds Patient Lines/Drains/Airways Status   Active Line/Drains/Airways    Name:   Placement date:   Placement time:   Site:   Days:   Peripheral IV 02/19/2019 Right Forearm   02/21/2019    -    Forearm   less than 1   External Urinary Catheter   10/11/18    -    -   109          Intake/Output Last 24 hours  Intake/Output Summary (Last 24 hours) at 02/21/2019 2003 Last data filed at 01/31/2019 1833 Gross per 24 hour  Intake 250 ml  Output -  Net 250 ml    Labs/Imaging Results for orders placed or performed during the hospital encounter of 01/30/2019 (from the past 48 hour(s))  Comprehensive metabolic panel     Status: Abnormal   Collection Time: 02/19/2019 11:34 AM  Result Value Ref Range   Sodium 141 135 - 145 mmol/L   Potassium 4.3 3.5 - 5.1 mmol/L   Chloride 114 (H) 98 - 111 mmol/L   CO2 11 (L) 22 - 32 mmol/L   Glucose, Bld 125 (H) 70 - 99 mg/dL   BUN 63 (H) 8 - 23 mg/dL   Creatinine, Ser 3.72 (H) 0.61 - 1.24 mg/dL   Calcium 7.8 (L) 8.9 - 10.3 mg/dL   Total Protein 6.6 6.5 - 8.1 g/dL   Albumin 2.6 (L) 3.5 -  5.0 g/dL   AST 20 15 - 41 U/L   ALT 11 0 - 44 U/L   Alkaline Phosphatase 86 38 - 126 U/L   Total Bilirubin 1.0 0.3 - 1.2 mg/dL   GFR calc non Af Amer 13 (L) >60 mL/min   GFR calc Af Amer 15 (L) >60 mL/min   Anion gap 16 (H) 5 - 15    Comment: Performed at [facility] [address], [place], [place] [address]  CBC with Differential     Status: Abnormal   Collection Time: 02/08/2019 11:34 AM  Result Value Ref Range   WBC 16.6 (H) 4.0 - 10.5 K/uL   RBC 2.87 (L) 4.22 - 5.81 MIL/uL   Hemoglobin 9.8 (L) 13.0 - 17.0 g/dL   HCT 32.7 (L) 39.0 - 52.0 %   MCV 113.9 (H) 80.0 - 100.0 fL   MCH 34.1 (H) 26.0 - 34.0 pg   MCHC 30.0 30.0 - 36.0 g/dL   RDW 15.7 (H) 11.5 - 15.5 %   Platelets 238 150 - 400 K/uL   nRBC 0.0 0.0 - 0.2 %   Neutrophils Relative % 83 %   Neutro Abs 13.6 (H) 1.7 - 7.7 K/uL   Lymphocytes Relative 11 %   Lymphs Abs 1.8 0.7 - 4.0 K/uL    Monocytes Relative 5 %   Monocytes Absolute 0.9 0.1 - 1.0 K/uL   Eosinophils Relative 0 %   Eosinophils Absolute 0.0 0.0 - 0.5 K/uL   Basophils Relative 0 %   Basophils Absolute 0.0 0.0 - 0.1 K/uL   Immature Granulocytes 1 %   Abs Immature Granulocytes 0.13 (H) 0.00 - 0.07 K/uL   Polychromasia PRESENT     Comment: Performed at [facility], [place] [address] [place] [address], [place], [place] [address]  D-dimer, quantitative     Status: Abnormal   Collection Time: 01/29/2019  1:46 PM  Result Value Ref Range   D-Dimer, Quant 3.44 (H) 0.00 - 0.50 ug/mL-FEU    Comment: (NOTE) At the manufacturer cut-off of 0.50 ug/mL FEU, this assay has been documented to exclude PE with a sensitivity and negative predictive value of 97 to 99%.  At this time, this assay has not been approved by the FDA to exclude DVT/VTE. Results should be correlated with clinical presentation. Performed at [facility], [place] [address] [place], [place] [address]   Procalcitonin     Status: None   Collection Time: 02/13/2019  1:46 PM  Result Value Ref Range   Procalcitonin 0.79 ng/mL    Comment:        Interpretation: PCT > 0.5 ng/mL and <= 2 ng/mL: Systemic infection (sepsis) is possible, but other conditions are known to elevate PCT as well. (NOTE)       Sepsis PCT Algorithm           Lower Respiratory Tract                                      Infection PCT Algorithm    ----------------------------     ----------------------------         PCT < 0.25 ng/mL                PCT < 0.10 ng/mL         Strongly encourage             Strongly discourage  discontinuation of antibiotics    initiation of antibiotics    ----------------------------     -----------------------------       PCT 0.25 - 0.50 ng/mL            PCT 0.10 - 0.25 ng/mL               OR       >80% decrease in PCT            Discourage initiation of                                            antibiotics      Encourage discontinuation           of  antibiotics    ----------------------------     -----------------------------         PCT >= 0.50 ng/mL              PCT 0.26 - 0.50 ng/mL                AND       <80% decrease in PCT             Encourage initiation of                                             antibiotics       Encourage continuation           of antibiotics    ----------------------------     -----------------------------        PCT >= 0.50 ng/mL                  PCT > 0.50 ng/mL               AND         increase in PCT                  Strongly encourage                                      initiation of antibiotics    Strongly encourage escalation           of antibiotics                                     -----------------------------                                           PCT <= 0.25 ng/mL                                                 OR                                        >  80% decrease in PCT                                     Discontinue / Do not initiate                                             antibiotics Performed at [facility], [place] [address], [place], [place] [address]   Lactate dehydrogenase     Status: Abnormal   Collection Time: 02/04/2019  1:46 PM  Result Value Ref Range   LDH 260 (H) 98 - 192 U/L    Comment: Performed at [facility], [place] [address] [place], [place] [address]  Ferritin     Status: Abnormal   Collection Time: 02/12/2019  1:46 PM  Result Value Ref Range   Ferritin 709 (H) 24 - 336 ng/mL    Comment: Performed at [facility] [address], [place], [place] [address]  Fibrinogen     Status: None   Collection Time: 01/31/2019  1:46 PM  Result Value Ref Range   Fibrinogen 469 210 - 475 mg/dL    Comment: Performed at [facility] [address] [place], [place] [address]  C-reactive protein     Status: Abnormal   Collection Time: 02/14/2019  1:46 PM  Result Value Ref Range   CRP 16.7 (H) <1.0 mg/dL    Comment: Performed at [facility] [address] [place], [place] [address]  Protime-INR     Status: Abnormal   Collection Time: 02/12/2019  1:46 PM  Result Value Ref Range   Prothrombin Time 15.7 (H) 11.4 - 15.2 seconds   INR 1.3 (H) 0.8 - 1.2    Comment: (NOTE) INR goal varies based on device and disease states. Performed at [facility], [place] [address] [place], [place] [address]   Triglycerides     Status: None   Collection Time: 02/07/2019  1:46 PM  Result Value Ref Range   Triglycerides 62 <150 mg/dL    Comment: Performed at [facility] [address], [place], [place] [address]  POC SARS Coronavirus 2 Ag-ED - Nasal Swab (BD Veritor Kit)     Status: Abnormal   Collection Time: 01/31/2019  1:49 PM  Result Value Ref Range   SARS Coronavirus 2 Ag POSITIVE (A) NEGATIVE    Comment: (NOTE) SARS-CoV-2 antigen PRESENT. Positive results indicate the presence of viral antigens, but clinical correlation with patient history and other diagnostic information is necessary to determine patient infection status.  Positive results do not rule out bacterial infection or co-infection  with other viruses. False positive results are rare but can occur, and confirmatory RT-PCR testing may be appropriate in some circumstances. The expected result is Negative. Fact Sheet for Patients: PodPark.tn Fact Sheet for Providers: GiftContent.is  This test is not yet approved or cleared by the [place] FDA and  has been authorized for detection and/or diagnosis of SARS-CoV-2 by FDA under an Emergency Use Authorization (EUA).  This EUA will remain in effect (meaning this test can be used) for the duration of  the COVID-19 declaration under Section 564(b)(1) of the Act, 21 U.S.C. section 360bbb-3(b)(1), unless the a uthorization is terminated or revoked sooner.   Lactic  acid, plasma     Status: Abnormal   Collection Time: 02/19/2019  1:58 PM  Result Value Ref  Range   Lactic Acid, Venous 2.7 (HH) 0.5 - 1.9 mmol/L    Comment: CRITICAL RESULT CALLED TO, READ BACK BY AND VERIFIED WITH: K.Kathleen Lime 1430 02/18/2019 CLARK,S Performed at [facility], [place] [address], [place], [place] [address]   Blood Culture (routine x 2)     Status: None (Preliminary result)   Collection Time: 01/29/2019  2:13 PM   Specimen: BLOOD RIGHT FOREARM  Result Value Ref Range   Specimen Description BLOOD RIGHT FOREARM    Special Requests      BOTTLES DRAWN AEROBIC AND ANAEROBIC Blood Culture results may not be optimal due to an inadequate volume of blood received in culture bottles   Culture      NO GROWTH <12 HOURS Performed at [facility], [place] [address], [place], [place] [address]    Report Status PENDING   Blood Culture (routine x 2)     Status: None (Preliminary result)   Collection Time: 02/18/2019  2:13 PM   Specimen: BLOOD  Result Value Ref Range   Specimen Description BLOOD LEFT ANTECUBITAL    Special Requests      BOTTLES DRAWN AEROBIC AND ANAEROBIC Blood Culture results may not be optimal due to an excessive volume of blood received in culture bottles   Culture      NO GROWTH <12 HOURS Performed at [facility] [address], [place], [place] [address]    Report Status PENDING   ABO/Rh     Status: None   Collection Time: 02/06/2019  3:24 PM  Result Value Ref Range   ABO/RH(D)      O POS Performed at [facility] [address], [place], [place] [address]   Procalcitonin     Status: None   Collection Time: 02/11/2019  3:26 PM  Result Value Ref Range   Procalcitonin 0.82 ng/mL    Comment:        Interpretation: PCT > 0.5 ng/mL and <= 2 ng/mL: Systemic infection (sepsis) is possible, but other conditions are known to elevate PCT as well. (NOTE)       Sepsis PCT Algorithm           Lower Respiratory Tract                                      Infection PCT Algorithm    ----------------------------      ----------------------------         PCT < 0.25 ng/mL                PCT < 0.10 ng/mL         Strongly encourage             Strongly discourage   discontinuation of antibiotics    initiation of antibiotics    ----------------------------     -----------------------------       PCT 0.25 - 0.50 ng/mL            PCT 0.10 - 0.25 ng/mL               OR       >80% decrease in PCT            Discourage initiation of  antibiotics      Encourage discontinuation           of antibiotics    ----------------------------     -----------------------------         PCT >= 0.50 ng/mL              PCT 0.26 - 0.50 ng/mL                AND       <80% decrease in PCT             Encourage initiation of                                             antibiotics       Encourage continuation           of antibiotics    ----------------------------     -----------------------------        PCT >= 0.50 ng/mL                  PCT > 0.50 ng/mL               AND         increase in PCT                  Strongly encourage                                      initiation of antibiotics    Strongly encourage escalation           of antibiotics                                     -----------------------------                                           PCT <= 0.25 ng/mL                                                 OR                                        > 80% decrease in PCT                                     Discontinue / Do not initiate                                             antibiotics Performed at [facility], [place] [address], [place], [place] [address]   Lactic acid, plasma     Status: None   Collection Time: 02/10/2019  4:00 PM  Result Value Ref Range   Lactic Acid, Venous 1.6 0.5 - 1.9 mmol/L    Comment: Performed at [facility] [address] [place], [place] [address]  Creatinine, urine, random     Status: None   Collection Time: 02/17/2019   4:25 PM  Result Value Ref Range   Creatinine, Urine 76.36 mg/dL    Comment: Performed at [facility] [address], [place], [place] [address]  Sodium, urine, random     Status: None   Collection Time: 01/23/2019  4:25 PM  Result Value Ref Range   Sodium, Ur 17 mmol/L    Comment: Performed at [facility] [address] [place],  [address]   Dg Chest Portable 1 View  Result Date: 01/24/2019 CLINICAL DATA:  COVID exposure and cough. Additional history provided: Productive cough, fever and neck pain with recent COVID exposure. EXAM: PORTABLE CHEST 1 VIEW COMPARISON:  Chest radiograph 03/28/2008 FINDINGS: Heart size within normal limits.  Aortic atherosclerosis. Chronic elevation of the left hemidiaphragm. Patchy ill-defined and interstitial opacities bilaterally, suspicious for pneumonia given provided history. No evidence of pneumothorax or sizable pleural effusion. No acute bony abnormality. Overlying cardiac monitoring leads. IMPRESSION: Patchy ill-defined and interstitial opacities within both lungs, suspicious for pneumonia given provided history. Radiographic follow-up to resolution recommended. Aortic atherosclerosis. Chronic elevation of the left hemidiaphragm. Electronically Signed   By: Kellie Simmering DO   On: 01/25/2019 13:09    Pending Labs Unresulted Labs (From admission, onward)    Start     Ordered   01/29/19 0500  CBC with Differential/Platelet  Daily,   R     02/21/2019 1531   01/29/19 0500  Comprehensive metabolic panel  Daily,   R     02/03/2019 1531   01/29/19 0500  C-reactive protein  Daily,   R     02/20/2019 1531   01/29/19 0500  D-dimer, quantitative (not at [facility])  Daily,   R     02/18/2019 1531   01/29/19 0500  Ferritin  Daily,   R     01/24/2019 1531   01/29/19 0500  Magnesium  Daily,   R     02/05/2019 1531   01/29/19 0500  Phosphorus  Daily,   R     02/07/2019 1531   02/19/2019 1531  Culture, sputum-assessment  Once,   R     02/04/2019 1531           Vitals/Pain Today's Vitals   02/16/2019 1926 02/11/2019 1927 02/10/2019 1928 02/17/2019 1930  BP:    108/73  Pulse:    (!) 25  Resp:    (!) 24  Temp:      TempSrc:      SpO2: (!) 85% (!) 72% (!) 88% (!) 69%    Isolation Precautions Airborne and Contact precautions  Medications Medications  sodium chloride flush (NS) 0.9 % injection 3 mL (has no administration in time range)  heparin injection 5,000 Units (has no administration in time range)  dexamethasone (DECADRON) injection 6 mg (6 mg Intravenous Given 02/11/2019 1721)  guaiFENesin-dextromethorphan (ROBITUSSIN DM) 100-10 MG/5ML syrup 10 mL (has no administration in time range)  chlorpheniramine-HYDROcodone (TUSSIONEX) 10-8 MG/5ML suspension 5 mL (has no administration in time range)  vitamin C (ASCORBIC ACID) tablet 500 mg (500 mg Oral Given 01/26/2019 1840)  zinc sulfate capsule 220 mg (220 mg Oral Given 02/12/2019 1720)  albuterol (VENTOLIN HFA) 108 (90 Base) MCG/ACT inhaler 2 puff (  2 puffs Inhalation Given 02/08/2019 1955)  acetaminophen (TYLENOL) tablet 650 mg (has no administration in time range)  famotidine (PEPCID) IVPB 20 mg premix (has no administration in time range)  lidocaine (LIDODERM) 5 % 1 patch (1 patch Transdermal Patch Applied 01/27/2019 1732)  remdesivir 200 mg in sodium chloride 0.9% 250 mL IVPB (0 mg Intravenous Stopped 02/07/2019 1833)    Followed by  remdesivir 100 mg in sodium chloride 0.9 % 100 mL IVPB (has no administration in time range)  metoprolol tartrate (LOPRESSOR) tablet 50 mg (has no administration in time range)  simvastatin (ZOCOR) tablet 10 mg (has no administration in time range)  mirtazapine (REMERON) tablet 15 mg (has no administration in time range)  aspirin EC tablet 81 mg (has no administration in time range)  diltiazem (CARDIZEM) 100 mg in dextrose 5 % 100 mL (1 mg/mL) infusion (has no administration in time range)  0.9 %  sodium chloride infusion ( Intravenous New Bag/Given 01/29/2019 1724)  diltiazem  (CARDIZEM) injection 5 mg (5 mg Intravenous Given 02/12/2019 1955)    Mobility walks with device High fall risk   Focused Assessments Pulmonary Assessment Handoff:  Lung sounds:   O2 Device: Nasal Cannula O2 Flow Rate (L/min): 4 L/min      R Recommendations: See Admitting Provider Note  Report given to:   Additional Notes:

## 2019-01-28 NOTE — ED Notes (Signed)
Report given to Ramond Dial at [facility].

## 2019-01-28 NOTE — ED Notes (Signed)
EDP made aware of pt LA level

## 2019-01-28 NOTE — ED Notes (Signed)
Helane Rima daughter, [phone], looking for an update on patient

## 2019-01-28 NOTE — ED Provider Notes (Signed)
[facility] EMERGENCY DEPARTMENT Provider Note   CSN: [id number] Arrival date & time: 02/19/2019  1122     History   Chief Complaint Chief Complaint  Patient presents with   Cough    HPI James Strong is a 85 y.o. male.     Patient is a 85 year old male with a history of CAD, hyperlipidemia, paroxysmal A. fib on Coumadin (although I don't see it today on his med list), chronic kidney disease who presents with cough and congestion.  I spoke with his daughter who reports that multiple family members are positive for Covid and they have had direct exposure to the patient.  He started feeling bad on Wednesday which was about 5 days ago with some coughing and decreased appetite.  He has not been eating or drinking well.  He has been fatigued and having some shortness of breath.  He does not wear home oxygen.  He was noted to have oxygen saturations of 79% on room air but they have improved to 90% on oxygen.  Patient is currently denying any complaints other than neck pain which reportedly has had before and uses a heating pad at home for.  His history is somewhat limited though although he has some confusion which daughter says is baseline for him although he has not been officially diagnosed with dementia     Past Medical History:  Diagnosis Date   Anemia    Benign hypertensive heart disease without heart failure    Chronic anticoagulation    CKD (chronic kidney disease)    Colon polyp    Coronary atherosclerosis of native coronary artery    nonobstructive ASCAD - 30-40% mid LAD and 30% mid PL by cath 2006   Hyperlipidemia    Iron deficiency    Melanoma of skin, site unspecified    Osteoarthrosis    PAF (paroxysmal atrial fibrillation) ([place])    on warfarin for CHADS2VASC score of 4   Prediabetes     Patient Active Problem List   Diagnosis Date Noted   UTI due to Klebsiella species 10/12/2018   Syncope 10/10/2018   RBBB (right bundle branch  block with left anterior fascicular block) 12/13/2012   Persistent atrial fibrillation ([place]) 12/13/2012   Chronic anticoagulation    CAD (coronary artery disease)    Prediabetes    Hyperlipidemia    Melanoma of skin, site unspecified    Anemia    Iron deficiency    Osteoarthrosis    CKD (chronic kidney disease)    HTN (hypertension)    Colon polyp     Past Surgical History:  Procedure Laterality Date   DENTAL SURGERY     MELANOMA EXCISION     superficial on scalp   skin melonoma removed from scalp          Home Medications    Prior to Admission medications   Medication Sig Start Date End Date Taking? Authorizing Provider  aspirin EC 81 MG tablet Take 81 mg by mouth daily.    [provider]  diltiazem (TIAZAC) 120 MG 24 hr capsule Take 120 mg by mouth daily.    [provider]  metoprolol (LOPRESSOR) 50 MG tablet Take 50 mg by mouth 2 (two) times daily.    [provider]  mirtazapine (REMERON) 15 MG tablet Take 15 mg by mouth at bedtime.    [provider]  simvastatin (ZOCOR) 20 MG tablet Take 10 mg by mouth at bedtime.  11/09/12  [provider]    Family History Family History  Problem Relation Age of Onset   Dementia Father     Social History Social History   Tobacco Use   Smoking status: Former Smoker    Quit date: 12/12/1969    Years since quitting: 49.1   Smokeless tobacco: Never Used  Substance Use Topics   Alcohol use: Not Currently   Drug use: Never     Allergies   Patient has no known allergies.   Review of Systems Review of Systems  Constitutional: Positive for fatigue. Negative for chills, diaphoresis and fever.  HENT: Negative for congestion, rhinorrhea and sneezing.   Eyes: Negative.   Respiratory: Positive for cough and shortness of breath. Negative for chest tightness.   Cardiovascular: Negative for chest pain and leg swelling.  Gastrointestinal: Negative for  abdominal pain, blood in stool, diarrhea, nausea and vomiting.  Genitourinary: Negative for difficulty urinating, flank pain, frequency and hematuria.  Musculoskeletal: Positive for neck pain. Negative for arthralgias and back pain.  Skin: Negative for rash.  Neurological: Positive for headaches. Negative for dizziness, speech difficulty, weakness and numbness.     Physical Exam Updated Vital Signs BP 123/71    Pulse 67    Temp 97.7 F (36.5 C) (Oral)    Resp 17    SpO2 100%   Physical Exam Constitutional:      Appearance: He is well-developed.  HENT:     Head: Normocephalic and atraumatic.  Eyes:     Pupils: Pupils are equal, round, and reactive to light.  Neck:     Musculoskeletal: Normal range of motion and neck supple.  Cardiovascular:     Rate and Rhythm: Normal rate and regular rhythm.     Heart sounds: Normal heart sounds.  Pulmonary:     Effort: Pulmonary effort is normal. Tachypnea present. No respiratory distress.     Breath sounds: No wheezing or rales.  Chest:     Chest wall: No tenderness.  Abdominal:     General: Bowel sounds are normal.     Palpations: Abdomen is soft.     Tenderness: There is no abdominal tenderness. There is no guarding or rebound.  Musculoskeletal: Normal range of motion.  Lymphadenopathy:     Cervical: No cervical adenopathy.  Skin:    General: Skin is warm and dry.     Findings: No rash.  Neurological:     General: No focal deficit present.     Mental Status: He is alert.     Comments: Oriented to person and place only      ED Treatments / Results  Labs (all labs ordered are listed, but only abnormal results are displayed) Labs Reviewed  COMPREHENSIVE METABOLIC PANEL - Abnormal; Notable for the following components:      Result Value   Chloride 114 (*)    CO2 11 (*)    Glucose, Bld 125 (*)    BUN 63 (*)    Creatinine, Ser 3.72 (*)    Calcium 7.8 (*)    Albumin 2.6 (*)    GFR calc non Af Amer 13 (*)    GFR calc Af Amer  15 (*)    Anion gap 16 (*)    All other components within normal limits  CBC WITH DIFFERENTIAL/PLATELET - Abnormal; Notable for the following components:   WBC 16.6 (*)    RBC 2.87 (*)    Hemoglobin 9.8 (*)    HCT 32.7 (*)    MCV  113.9 (*)    MCH 34.1 (*)    RDW 15.7 (*)    Neutro Abs 13.6 (*)    Abs Immature Granulocytes 0.13 (*)    All other components within normal limits  LACTIC ACID, PLASMA - Abnormal; Notable for the following components:   Lactic Acid, Venous 2.7 (*)    All other components within normal limits  D-DIMER, QUANTITATIVE (NOT AT [facility]) - Abnormal; Notable for the following components:   D-Dimer, Quant 3.44 (*)    All other components within normal limits  LACTATE DEHYDROGENASE - Abnormal; Notable for the following components:   LDH 260 (*)    All other components within normal limits  PROTIME-INR - Abnormal; Notable for the following components:   Prothrombin Time 15.7 (*)    INR 1.3 (*)    All other components within normal limits  POC SARS CORONAVIRUS 2 AG -  ED - Abnormal; Notable for the following components:   SARS Coronavirus 2 Ag POSITIVE (*)    All other components within normal limits  CULTURE, BLOOD (ROUTINE X 2)  CULTURE, BLOOD (ROUTINE X 2)  FIBRINOGEN  TRIGLYCERIDES  LACTIC ACID, PLASMA  PROCALCITONIN  FERRITIN  C-REACTIVE PROTEIN    EKG None  Radiology Dg Chest Portable 1 View  Result Date: 02/13/2019 CLINICAL DATA:  COVID exposure and cough. Additional history provided: Productive cough, fever and neck pain with recent COVID exposure. EXAM: PORTABLE CHEST 1 VIEW COMPARISON:  Chest radiograph 03/28/2008 FINDINGS: Heart size within normal limits.  Aortic atherosclerosis. Chronic elevation of the left hemidiaphragm. Patchy ill-defined and interstitial opacities bilaterally, suspicious for pneumonia given provided history. No evidence of pneumothorax or sizable pleural effusion. No acute bony abnormality. Overlying cardiac monitoring leads.  IMPRESSION: Patchy ill-defined and interstitial opacities within both lungs, suspicious for pneumonia given provided history. Radiographic follow-up to resolution recommended. Aortic atherosclerosis. Chronic elevation of the left hemidiaphragm. Electronically Signed   By: Kellie Simmering DO   On: 02/19/2019 13:09    Procedures Procedures (including critical care time)  Medications Ordered in ED Medications  sodium chloride flush (NS) 0.9 % injection 3 mL (has no administration in time range)     Initial Impression / Assessment and Plan / ED Course  I have reviewed the triage vital signs and the nursing notes.  Pertinent labs & imaging results that were available during my care of the patient were reviewed by me and considered in my medical decision making (see chart for details).        Patient is a 85 year old male who presents with cough and fatigue.  His Covid test is positive.  His chest x-ray shows bilateral patchy infiltrates.  He is requiring oxygen saturation at 4 L/min to maintain sats in the 90s.  He is mildly tachypneic.  He has an elevated creatinine which is similar to his prior values, slightly more elevated.  I spoke with Dr. Tamala Julian who will admit the patient for further treatment.  Final Clinical Impressions(s) / ED Diagnoses   Final diagnoses:  COVID-19 virus infection  Hypoxia    ED Discharge Orders    None       Malvin Johns, MD 01/30/2019 1442

## 2019-01-28 NOTE — H&P (Addendum)
History and Physical    James Strong PFY:[id number] DOB: 04-Aug-1923 DOA: 01/30/2019  Referring MD/NP/PA: Malvin Johns, MD PCP: Wenda Low, MD  Patient coming from: home   Chief Complaint: Cough  I have personally briefly reviewed patient's old medical records in [place]   HPI: James Strong is a 85 y.o. male with medical history significant of PAF not on anticoagulation, CKD stage IV, CAD, and hyperlipidemia. He presents with complaints of nonproductive cough and weakness for last 5 days.  History is obtained from the patient and his daughter over the phone.  Multiple family members came back positive for Covid all at once and had exposure to the patient.  He normally ambulates with the use of a walker, but become so weak that he was unable to stand after using the restroom.  His appetite overall is poor, but he has been keeping hydrated.  Denies having any significant fever, chills, nausea, vomiting, diarrhea, chest pain, or dysuria.  His daughter makes note that he was stopped off anticoagulation over the summer due to increased risk of falls.    ED Course: On admission into the emergency department patient was noted to be afebrile, pulse 67, respirations 14-23, blood pressures maintained, and O2 saturations 94- 100% on 4 L nasal cannula oxygen.  Labs significant for WBC 16.6, hemoglobin 9.8, BUN 63, creatinine 3.72, lactic acid 2.7, CRP 16.7, ferritin 709, LDH 260, D-dimer 3.44, fibrinogen 469, and INR 1.3.  COVID-19 screening positive.  Blood cultures were obtained.  Patchy interstitial opacities in both lungs concerning for pneumonia.  Review of systems: A complete 10 point review of systems was performed and negative except for as noted above in the HPI  Past Medical History:  Diagnosis Date   Anemia    Benign hypertensive heart disease without heart failure    Chronic anticoagulation    CKD (chronic kidney disease)    Colon polyp    Coronary atherosclerosis  of native coronary artery    nonobstructive ASCAD - 30-40% mid LAD and 30% mid PL by cath 2006   Hyperlipidemia    Iron deficiency    Melanoma of skin, site unspecified    Osteoarthrosis    PAF (paroxysmal atrial fibrillation) ([facility])    on warfarin for CHADS2VASC score of 4   Prediabetes     Past Surgical History:  Procedure Laterality Date   DENTAL SURGERY     MELANOMA EXCISION     superficial on scalp   skin melonoma removed from scalp       reports that he quit smoking about 49 years ago. He has never used smokeless tobacco. He reports previous alcohol use. He reports that he does not use drugs.  No Known Allergies  Family History  Problem Relation Age of Onset   Dementia Father     Prior to Admission medications   Medication Sig Start Date End Date Taking? Authorizing Provider  aspirin EC 81 MG tablet Take 81 mg by mouth daily.    [provider]  diltiazem (TIAZAC) 120 MG 24 hr capsule Take 120 mg by mouth daily.    [provider]  metoprolol (LOPRESSOR) 50 MG tablet Take 50 mg by mouth 2 (two) times daily.    [provider]  mirtazapine (REMERON) 15 MG tablet Take 15 mg by mouth at bedtime.    [provider]  simvastatin (ZOCOR) 20 MG tablet Take 10 mg by mouth at bedtime.  11/09/12   [provider]  Physical Exam:  Constitutional: Elderly male who appears to be acutely sick Vitals:   02/18/2019 1230 02/02/2019 1315 02/04/2019 1330 02/14/2019 1400  BP: (!) 112/58 121/65 (!) 115/53 123/71  Pulse:      Resp: 18 (!) 23 18 17   Temp:      TempSrc:      SpO2:       Eyes: PERRL, lids and conjunctivae normal ENMT: Mucous membranes are dry. Posterior pharynx clear of any exudate or lesions.  Neck: normal, supple, no masses, no thyromegaly Respiratory: Mildly tachypneic currently on 4 L nasal cannula oxygen with O2 saturations maintained.  No significant wheezes appreciated. Cardiovascular: Regular rate, no murmurs /  rubs / gallops. No extremity edema. 2+ pedal pulses. No carotid bruits.  Abdomen: no tenderness, no masses palpated. No hepatosplenomegaly. Bowel sounds positive.  Musculoskeletal: no clubbing / cyanosis. No joint deformity upper and lower extremities. Good ROM, no contractures. Normal muscle tone.  Skin: no rashes, lesions, ulcers. No induration Neurologic: CN 2-12 grossly intact. Sensation intact, DTR normal. Strength 5/5 in all 4.  Psychiatric: Normal judgment and insight. Alert and oriented x 3. Normal mood.     Labs on Admission: I have personally reviewed following labs and imaging studies  CBC: Recent Labs  Lab 02/04/2019 1134  WBC 16.6*  NEUTROABS 13.6*  HGB 9.8*  HCT 32.7*  MCV 113.9*  PLT 474   Basic Metabolic Panel: Recent Labs  Lab 02/09/2019 1134  NA 141  K 4.3  CL 114*  CO2 11*  GLUCOSE 125*  BUN 63*  CREATININE 3.72*  CALCIUM 7.8*   GFR: CrCl cannot be calculated (Unknown ideal weight.). Liver Function Tests: Recent Labs  Lab 02/15/2019 1134  AST 20  ALT 11  ALKPHOS 86  BILITOT 1.0  PROT 6.6  ALBUMIN 2.6*   No results for input(s): LIPASE, AMYLASE in the last 168 hours. No results for input(s): AMMONIA in the last 168 hours. Coagulation Profile: Recent Labs  Lab 02/10/2019 1346  INR 1.3*   Cardiac Enzymes: No results for input(s): CKTOTAL, CKMB, CKMBINDEX, TROPONINI in the last 168 hours. BNP (last 3 results) No results for input(s): PROBNP in the last 8760 hours. HbA1C: No results for input(s): HGBA1C in the last 72 hours. CBG: No results for input(s): GLUCAP in the last 168 hours. Lipid Profile: Recent Labs    02/12/2019 1346  TRIG 62   Thyroid Function Tests: No results for input(s): TSH, T4TOTAL, FREET4, T3FREE, THYROIDAB in the last 72 hours. Anemia Panel: No results for input(s): VITAMINB12, FOLATE, FERRITIN, TIBC, IRON, RETICCTPCT in the last 72 hours. Urine analysis:    Component Value Date/Time   COLORURINE YELLOW 01/08/2019  1326   APPEARANCEUR TURBID (A) 01/08/2019 1326   LABSPEC 1.011 01/08/2019 1326   PHURINE 5.0 01/08/2019 1326   GLUCOSEU NEGATIVE 01/08/2019 1326   HGBUR MODERATE (A) 01/08/2019 1326   BILIRUBINUR NEGATIVE 01/08/2019 1326   KETONESUR NEGATIVE 01/08/2019 1326   PROTEINUR 100 (A) 01/08/2019 1326   UROBILINOGEN 1.0 03/28/2008 1212   NITRITE NEGATIVE 01/08/2019 1326   LEUKOCYTESUR LARGE (A) 01/08/2019 1326   Sepsis Labs: No results found for this or any previous visit (from the past 240 hour(s)).   Radiological Exams on Admission: Dg Chest Portable 1 View  Result Date: 01/25/2019 CLINICAL DATA:  COVID exposure and cough. Additional history provided: Productive cough, fever and neck pain with recent COVID exposure. EXAM: PORTABLE CHEST 1 VIEW COMPARISON:  Chest radiograph 03/28/2008 FINDINGS: Heart size within normal limits.  Aortic atherosclerosis. Chronic elevation of the left hemidiaphragm. Patchy ill-defined and interstitial opacities bilaterally, suspicious for pneumonia given provided history. No evidence of pneumothorax or sizable pleural effusion. No acute bony abnormality. Overlying cardiac monitoring leads. IMPRESSION: Patchy ill-defined and interstitial opacities within both lungs, suspicious for pneumonia given provided history. Radiographic follow-up to resolution recommended. Aortic atherosclerosis. Chronic elevation of the left hemidiaphragm. Electronically Signed   By: Kellie Simmering DO   On: 02/09/2019 13:09    EKG: Independently reviewed.  Sinus tachycardia 120 bpm  Assessment/Plan Respiratory failure with hypoxia secondary pneumonia due to to COVID-19 and sepsis: Acute.  Patient presents with tachypnea and complaints of cough.  WBC elevated at 16.6 and lactic acid 2.7.  O2 saturation noted in the 70s on room air for which patient was placed on 4 L nasal cannula oxygen with improvement.  Chest x-ray showed bilateral infiltrates and COVID-19 screening was positive.  Inflammatory  markers were noted to be elevated. -Admit to a tele[facility] at [facility] -COVID-19 order set utilized -Check sputum and blood cultures -Continuous pulse oximetry with nasal cannula oxygen to maintain O2 saturations greater than 90% -Aspiration precaution -Check procalcitonin -Albuterol inhaler -Dexamethasone 6 mg IV daily -Antitussives as needed  -Pharmacy consult to start remdesivir -Daily monitoring of inflammatory markers  Lactic acidosis: Acute. Initial lactic acid 2.7 on admission. -Trend lactic acid levels  Acute kidney injury superimposed on chronic kidney disease stage IV: Patient's baseline creatinine previously noted to be around 3 on 11/17.  Patient presents with creatinine 3.72 BUN 63.   -Check urine creatinine and urine sodium -Gentle IV fluids at 75 mL/h -Recheck kidney function in a.m.  Neck pain: Chronic.  Patient reports having significant neck pain. -Lidocaine patch to the neck  Paroxysmal atrial fibrillation: Patient appears to be relatively rate controlled at this time.  He had been taken off anticoagulation early this summer due to risk of falls. -Continue aspirin and diltiazem  Essential hypertension -Continue metoprolol and diltiazem  Anemia of chronic disease: Admission patient hemoglobin 9.8 which appears similar to previous. -Continue to monitor  CHF: Last echocardiogram from 09/2018 with EF 50 - 55%  Hyperlipidemia -Continue simvastatin  DO NOT RESUSCITATE: Present on admission  GI prophylaxis: Pepcid IV DVT prophylaxis: Heparin Code Status: Full Family Communication: Spoke with daughter who is her POA  Disposition Plan: TBD Consults called: none Admission status: Inpatient  Norval Morton MD [facility] Pager [phone]   If 7PM-7AM, please contact night-coverage [email or web address] Password [facility]  01/22/2019, 2:39 PM

## 2019-01-28 NOTE — ED Notes (Signed)
Condom Cath has been placed on patient.

## 2019-01-28 NOTE — ED Notes (Signed)
Pt on 10L 02 per high flow [place]

## 2019-01-29 ENCOUNTER — Inpatient Hospital Stay (HOSPITAL_COMMUNITY): Payer: Medicare Other

## 2019-01-29 LAB — CBC WITH DIFFERENTIAL/PLATELET
Abs Immature Granulocytes: 0.04 10*3/uL (ref 0.00–0.07)
Basophils Absolute: 0 10*3/uL (ref 0.0–0.1)
Basophils Relative: 0 %
Eosinophils Absolute: 0 10*3/uL (ref 0.0–0.5)
Eosinophils Relative: 0 %
HCT: 27.3 % — ABNORMAL LOW (ref 39.0–52.0)
Hemoglobin: 8.6 g/dL — ABNORMAL LOW (ref 13.0–17.0)
Immature Granulocytes: 1 %
Lymphocytes Relative: 7 %
Lymphs Abs: 0.4 10*3/uL — ABNORMAL LOW (ref 0.7–4.0)
MCH: 34 pg (ref 26.0–34.0)
MCHC: 31.5 g/dL (ref 30.0–36.0)
MCV: 107.9 fL — ABNORMAL HIGH (ref 80.0–100.0)
Monocytes Absolute: 0.2 10*3/uL (ref 0.1–1.0)
Monocytes Relative: 4 %
Neutro Abs: 4.4 10*3/uL (ref 1.7–7.7)
Neutrophils Relative %: 88 %
Platelets: 167 10*3/uL (ref 150–400)
RBC: 2.53 MIL/uL — ABNORMAL LOW (ref 4.22–5.81)
RDW: 15.7 % — ABNORMAL HIGH (ref 11.5–15.5)
WBC: 4.9 10*3/uL (ref 4.0–10.5)
nRBC: 0 % (ref 0.0–0.2)

## 2019-01-29 LAB — COMPREHENSIVE METABOLIC PANEL
ALT: 10 U/L (ref 0–44)
AST: 18 U/L (ref 15–41)
Albumin: 2.2 g/dL — ABNORMAL LOW (ref 3.5–5.0)
Alkaline Phosphatase: 68 U/L (ref 38–126)
Anion gap: 14 (ref 5–15)
BUN: 70 mg/dL — ABNORMAL HIGH (ref 8–23)
CO2: 11 mmol/L — ABNORMAL LOW (ref 22–32)
Calcium: 7.5 mg/dL — ABNORMAL LOW (ref 8.9–10.3)
Chloride: 118 mmol/L — ABNORMAL HIGH (ref 98–111)
Creatinine, Ser: 3.59 mg/dL — ABNORMAL HIGH (ref 0.61–1.24)
GFR calc Af Amer: 16 mL/min — ABNORMAL LOW (ref >60)
GFR calc non Af Amer: 14 mL/min — ABNORMAL LOW (ref >60)
Glucose, Bld: 121 mg/dL — ABNORMAL HIGH (ref 70–99)
Potassium: 4.5 mmol/L (ref 3.5–5.1)
Sodium: 143 mmol/L (ref 135–145)
Total Bilirubin: 0.7 mg/dL (ref 0.3–1.2)
Total Protein: 5.5 g/dL — ABNORMAL LOW (ref 6.5–8.1)

## 2019-01-29 LAB — ABO/RH: ABO/RH(D): O POS

## 2019-01-29 LAB — HIV ANTIBODY (ROUTINE TESTING W REFLEX): HIV Screen 4th Generation wRfx: NONREACTIVE

## 2019-01-29 LAB — MAGNESIUM: Magnesium: 1.8 mg/dL (ref 1.7–2.4)

## 2019-01-29 LAB — FERRITIN: Ferritin: 560 ng/mL — ABNORMAL HIGH (ref 24–336)

## 2019-01-29 LAB — TYPE AND SCREEN
ABO/RH(D): O POS
Antibody Screen: NEGATIVE

## 2019-01-29 LAB — BRAIN NATRIURETIC PEPTIDE: B Natriuretic Peptide: 515 pg/mL — ABNORMAL HIGH (ref 0.0–100.0)

## 2019-01-29 LAB — PHOSPHORUS: Phosphorus: 6.4 mg/dL — ABNORMAL HIGH (ref 2.5–4.6)

## 2019-01-29 LAB — D-DIMER, QUANTITATIVE: D-Dimer, Quant: 2.74 ug/mL-FEU — ABNORMAL HIGH (ref 0.00–0.50)

## 2019-01-29 LAB — C-REACTIVE PROTEIN: CRP: 18.3 mg/dL — ABNORMAL HIGH (ref <1.0)

## 2019-01-29 MED ORDER — STERILE WATER FOR INJECTION IV SOLN
INTRAVENOUS | Status: DC
  Administered 2019-01-29 – 2019-01-30 (×2): via INTRAVENOUS
  Filled 2019-01-29 (×2): qty 850

## 2019-01-29 MED ORDER — METHYLPREDNISOLONE SODIUM SUCC 40 MG IJ SOLR
40.0000 mg | Freq: Two times a day (BID) | INTRAMUSCULAR | Status: DC
  Administered 2019-01-29 (×2): 40 mg via INTRAVENOUS
  Filled 2019-01-29 (×3): qty 1

## 2019-01-29 MED ORDER — FAMOTIDINE IN NACL 20-0.9 MG/50ML-% IV SOLN
20.0000 mg | INTRAVENOUS | Status: DC
  Administered 2019-01-29: 20 mg via INTRAVENOUS
  Filled 2019-01-29 (×2): qty 50

## 2019-01-29 MED ORDER — ENSURE ENLIVE PO LIQD
237.0000 mL | Freq: Three times a day (TID) | ORAL | Status: DC
  Administered 2019-01-29 – 2019-01-30 (×2): 237 mL via ORAL

## 2019-01-29 MED ORDER — SODIUM CHLORIDE 0.9% IV SOLUTION
Freq: Once | INTRAVENOUS | Status: AC
  Administered 2019-01-29: 15:00:00 via INTRAVENOUS

## 2019-01-29 MED ORDER — ENSURE ENLIVE PO LIQD
237.0000 mL | Freq: Two times a day (BID) | ORAL | Status: DC
  Administered 2019-01-29 (×2): 237 mL via ORAL

## 2019-01-29 MED ORDER — PNEUMOCOCCAL VAC POLYVALENT 25 MCG/0.5ML IJ INJ
0.5000 mL | INJECTION | INTRAMUSCULAR | Status: AC
  Administered 2019-01-30: 12:00:00 0.5 mL via INTRAMUSCULAR
  Filled 2019-01-29: qty 0.5

## 2019-01-29 MED ORDER — SODIUM CHLORIDE 0.9 % IV SOLN
2.0000 g | INTRAVENOUS | Status: DC
  Administered 2019-01-29 – 2019-01-30 (×2): 2 g via INTRAVENOUS
  Filled 2019-01-29 (×2): qty 20

## 2019-01-29 MED ORDER — AZITHROMYCIN 250 MG PO TABS
500.0000 mg | ORAL_TABLET | Freq: Every day | ORAL | Status: DC
  Administered 2019-01-29 – 2019-01-30 (×2): 500 mg via ORAL
  Filled 2019-01-29: qty 1
  Filled 2019-01-29 (×2): qty 2

## 2019-01-29 MED FILL — Diltiazem HCl IV Soln 125 MG/25ML (5 MG/ML): INTRAVENOUS | Qty: 125 | Status: AC

## 2019-01-29 MED FILL — Dextrose Inj 5%: INTRAVENOUS | Qty: 100 | Status: AC

## 2019-01-29 NOTE — Progress Notes (Signed)
Pharmacy Renal Dosing Adjustment  Drug: famotidine Original dose: famotidine 20mg  q12h Scr:  3.72 mg/dL CrClest~ 57mL/min New dose: famotidine 20mg  q24h  Thank You, Despina Pole, Pharm. D. Clinical Pharmacist 01/29/2019 4:48 AM

## 2019-01-29 NOTE — Progress Notes (Signed)
PROGRESS NOTE    James Strong  James Strong:[id number] DOB: April 04, 1923 DOA: 02/21/2019 PCP: Wenda Low, MD   Brief Narrative:  James Strong is James Strong 85 y.o. male with medical history significant of PAF not on anticoagulation, CKD stage IV, CAD, and hyperlipidemia. He presents with complaints of nonproductive cough and weakness for last 5 days.  History is obtained from the patient and his daughter over the phone.  Multiple family members came back positive for Covid all at once and had exposure to the patient.  He normally ambulates with the use of James Strong walker, but become so weak that he was unable to stand after using the restroom.  His appetite overall is poor, but he has been keeping hydrated.  Denies having any significant fever, chills, nausea, vomiting, diarrhea, chest pain, or dysuria.  His daughter makes note that he was stopped off anticoagulation over the summer due to increased risk of falls.    ED Course: On admission into the emergency department patient was noted to be afebrile, pulse 67, respirations 14-23, blood pressures maintained, and O2 saturations 94- 100% on 4 L nasal cannula oxygen.  Labs significant for WBC 16.6, hemoglobin 9.8, BUN 63, creatinine 3.72, lactic acid 2.7, CRP 16.7, ferritin 709, LDH 260, D-dimer 3.44, fibrinogen 469, and INR 1.3.  COVID-19 screening positive.  Blood cultures were obtained.  Patchy interstitial opacities in both lungs concerning for pneumonia.  Assessment & Plan:   Principal Problem:   Pneumonia due to COVID-19 virus Active Problems:   Hyperlipidemia   Anemia   HTN (hypertension)   Sepsis (James Strong)   Acute respiratory failure with hypoxia (HCC)   Neck pain   Lactic acidosis  Respiratory failure with hypoxia secondary pneumonia due to to COVID-19 and sepsis:  Currently requiring 8 L [place], he's mildly tachycardic and occasionally tachypneic.   WBC at presentation elevated to 16.6 with procalcitonin of 0.82 CXR 12/8 with progression of bilateral  hazy pulm infiltrates c/w bilateral pneumonia Steroids, remdesivir Discussed risks, benefits of plasma with daughter James Strong.  Reviewed consent with her.  She and family were agreeable.  Plasma given 12/8. With elevated pct and WBC at presentation, start ceftriaxone/azithromycin. I/O, daily weights - consider lasix, will hold today based on BP (getting IVF with bicarb given NAGMA below) OOB, IS, prone as able Daily inflammatory labs  COVID-19 Labs  Recent Labs    01/24/2019 1346 01/29/19 0845  DDIMER 3.44* 2.74*  FERRITIN 709* 560*  LDH 260*  --   CRP 16.7* 18.3*    Lab Results  Component Value Date   SARSCOV2NAA NEGATIVE 10/10/2018   Lactic acidosis: Acute. Initial lactic acid 2.7 on admission. - resolved  Acute kidney injury superimposed on chronic kidney disease stage IV   Non Anion Gap Metabolic Acidosis: Patient's baseline creatinine previously noted to be around 3 on 11/17.  Patient presents with creatinine 3.72 BUN 63. Improving on hospital day 1 Transition to bicarb in sterile water, follow NAGMA   Follow renal function  Neck pain: Chronic.  Patient reports having significant neck pain. -Lidocaine patch to the neck -improved  Paroxysmal atrial fibrillation: Patient appears to be relatively rate controlled at this time.  He had been taken off anticoagulation early this summer due to risk of falls. -Continue aspirin and diltiazem (currently on dilt gtt, will continue for now)  Essential hypertension -Continue metoprolol and diltiazem  Anemia of chronic disease: Admission patient hemoglobin 9.8 which appears similar to previous. - 8.6 today, likely component of dilution -  follow iron, b12, folate, ferritin  CHF: Last echocardiogram from 09/2018 with EF 50 - 55%  Hyperlipidemia -Continue simvastatin  DO NOT RESUSCITATE: Present on admission  GI prophylaxis: Pepcid IV  DVT prophylaxis: heparin Code Status: DNR Family Communication: daughters over  phone Disposition Plan: pending further improvemetn  Consultants:   none  Procedures:   none  Antimicrobials:  Anti-infectives (From admission, onward)   Start     Dose/Rate Route Frequency Ordered Stop   01/29/19 1000  remdesivir 100 mg in sodium chloride 0.9 % 100 mL IVPB     100 mg 200 mL/hr over 30 Minutes Intravenous Daily 01/29/2019 1551 02/02/19 0959   01/29/19 1000  cefTRIAXone (ROCEPHIN) 2 g in sodium chloride 0.9 % 100 mL IVPB     2 g 200 mL/hr over 30 Minutes Intravenous Every 24 hours 01/29/19 0925 02/03/19 0959   01/29/19 1000  azithromycin (ZITHROMAX) tablet 500 mg     500 mg Oral Daily 01/29/19 0925 02/03/19 0959   02/07/2019 1600  remdesivir 200 mg in sodium chloride 0.9% 250 mL IVPB     200 mg 580 mL/hr over 30 Minutes Intravenous Once 02/13/2019 1551 01/24/2019 1833   02/17/2019 1545  remdesivir 200 mg in sodium chloride 0.9% 250 mL IVPB  Status:  Discontinued     200 mg 580 mL/hr over 30 Minutes Intravenous Once 02/17/2019 1542 02/09/2019 1542     Subjective: Feels ok. No complaints.  Says his breathing is good, no CP, though he's on 8 L. Knows he's in hospital, not sure what city.  Knows November.  Objective: Vitals:   01/29/19 0730 01/29/19 0806 01/29/19 0830 01/29/19 0930  BP: (!) 98/54 (!) 95/56 (!) 101/54 (!) 100/52  Pulse: (!) 103  (!) 105 (!) 107  Resp: 18 (!) 22 19 19   Temp:  98.1 F (36.7 C)    TempSrc:  Axillary    SpO2: 93%  92% 93%  Weight:        Intake/Output Summary (Last 24 hours) at 01/29/2019 1338 Last data filed at 01/29/2019 0900 Gross per 24 hour  Intake 350 ml  Output 300 ml  Net 50 ml   Filed Weights   01/29/19 0500  Weight: 52.3 kg    Examination:  General exam: Appears calm and comfortable  Respiratory system: distant breath sounds with isolation scope, appears unlabored Cardiovascular system: RRR Gastrointestinal system: Abdomen is nondistended, soft and nontender.  Central nervous system: Alert and oriented. No focal  neurological deficits. Extremities: no LEE Skin: No rashes, lesions or ulcers Psychiatry: Judgement and insight appear normal. Mood & affect appropriate.     Data Reviewed: I have personally reviewed following labs and imaging studies  CBC: Recent Labs  Lab 01/29/2019 1134 01/29/19 0845  WBC 16.6* 4.9  NEUTROABS 13.6* 4.4  HGB 9.8* 8.6*  HCT 32.7* 27.3*  MCV 113.9* 107.9*  PLT 238 076   Basic Metabolic Panel: Recent Labs  Lab 02/15/2019 1134 01/29/19 0845  NA 141 143  K 4.3 4.5  CL 114* 118*  CO2 11* 11*  GLUCOSE 125* 121*  BUN 63* 70*  CREATININE 3.72* 3.59*  CALCIUM 7.8* 7.5*  MG  --  1.8  PHOS  --  6.4*   GFR: Estimated Creatinine Clearance: 9.5 mL/min (Daxter Paule) (by C-G formula based on SCr of 3.59 mg/dL (H)). Liver Function Tests: Recent Labs  Lab 02/06/2019 1134 01/29/19 0845  AST 20 18  ALT 11 10  ALKPHOS 86 68  BILITOT 1.0 0.7  PROT  6.6 5.5*  ALBUMIN 2.6* 2.2*   No results for input(s): LIPASE, AMYLASE in the last 168 hours. No results for input(s): AMMONIA in the last 168 hours. Coagulation Profile: Recent Labs  Lab 02/11/2019 1346  INR 1.3*   Cardiac Enzymes: No results for input(s): CKTOTAL, CKMB, CKMBINDEX, TROPONINI in the last 168 hours. BNP (last 3 results) No results for input(s): PROBNP in the last 8760 hours. HbA1C: No results for input(s): HGBA1C in the last 72 hours. CBG: No results for input(s): GLUCAP in the last 168 hours. Lipid Profile: Recent Labs    01/24/2019 1346  TRIG 62   Thyroid Function Tests: No results for input(s): TSH, T4TOTAL, FREET4, T3FREE, THYROIDAB in the last 72 hours. Anemia Panel: Recent Labs    02/09/2019 1346 01/29/19 0845  FERRITIN 709* 560*   Sepsis Labs: Recent Labs  Lab 01/31/2019 1346 02/11/2019 1358 01/24/2019 1526 02/06/2019 1600  PROCALCITON 0.79  --  0.82  --   LATICACIDVEN  --  2.7*  --  1.6    Recent Results (from the past 240 hour(s))  Blood Culture (routine x 2)     Status: None (Preliminary  result)   Collection Time: 01/25/2019  2:13 PM   Specimen: BLOOD RIGHT FOREARM  Result Value Ref Range Status   Specimen Description BLOOD RIGHT FOREARM  Final   Special Requests   Final    BOTTLES DRAWN AEROBIC AND ANAEROBIC Blood Culture results may not be optimal due to an inadequate volume of blood received in culture bottles   Culture   Final    NO GROWTH < 24 HOURS Performed at [facility], [place] [address] [place], [place] [address]    Report Status PENDING  Incomplete  Blood Culture (routine x 2)     Status: None (Preliminary result)   Collection Time: 01/25/2019  2:13 PM   Specimen: BLOOD  Result Value Ref Range Status   Specimen Description BLOOD LEFT ANTECUBITAL  Final   Special Requests   Final    BOTTLES DRAWN AEROBIC AND ANAEROBIC Blood Culture results may not be optimal due to an excessive volume of blood received in culture bottles   Culture   Final    NO GROWTH < 24 HOURS Performed at [facility], [place] [address], [place], [place] [address]    Report Status PENDING  Incomplete         Radiology Studies: Dg Chest Port 1 View  Result Date: 01/29/2019 CLINICAL DATA:  Hypoxia. COVID pneumonia. EXAM: PORTABLE CHEST 1 VIEW COMPARISON:  01/24/2019 and 03/28/2008 FINDINGS: There has been rapid progression of extensive bilateral hazy pulmonary infiltrates. Heart size and vascularity are normal. Chronic elevation of the left hemidiaphragm. No significant bone abnormality. IMPRESSION: 1. Rapid progression of extensive bilateral hazy pulmonary infiltrates consistent with bilateral pneumonia. No pleural effusion or pneumothorax. 2. Chronic elevation of the left hemidiaphragm. Electronically Signed   By: Lorriane Shire M.D.   On: 01/29/2019 08:40   Dg Chest Portable 1 View  Result Date: 02/12/2019 CLINICAL DATA:  COVID exposure and cough. Additional history provided: Productive cough, fever and neck pain with recent COVID exposure. EXAM: PORTABLE CHEST 1 VIEW  COMPARISON:  Chest radiograph 03/28/2008 FINDINGS: Heart size within normal limits.  Aortic atherosclerosis. Chronic elevation of the left hemidiaphragm. Patchy ill-defined and interstitial opacities bilaterally, suspicious for pneumonia given provided history. No evidence of pneumothorax or sizable pleural effusion. No acute bony abnormality. Overlying cardiac monitoring leads. IMPRESSION: Patchy ill-defined and interstitial opacities  within both lungs, suspicious for pneumonia given provided history. Radiographic follow-up to resolution recommended. Aortic atherosclerosis. Chronic elevation of the left hemidiaphragm. Electronically Signed   By: Kellie Simmering DO   On: 01/31/2019 13:09        Scheduled Meds:  sodium chloride   Intravenous Once   albuterol  2 puff Inhalation Q6H   aspirin EC  81 mg Oral Daily   azithromycin  500 mg Oral Daily   feeding supplement (ENSURE ENLIVE)  237 mL Oral BID BM   heparin  5,000 Units Subcutaneous Q8H   lidocaine  1 patch Transdermal Q24H   methylPREDNISolone (SOLU-MEDROL) injection  40 mg Intravenous Q12H   metoprolol tartrate  50 mg Oral BID   mirtazapine  15 mg Oral QHS   [START ON 01/30/2019] pneumococcal 23 valent vaccine  0.5 mL Intramuscular Tomorrow-1000   simvastatin  10 mg Oral QHS   sodium chloride flush  3 mL Intravenous Once   vitamin C  500 mg Oral Daily   zinc sulfate  220 mg Oral Daily   Continuous Infusions:  cefTRIAXone (ROCEPHIN)  IV 2 g (01/29/19 1047)   dilTIAZem HCl-Dextrose 5 mg/hr (01/29/19 0515)   famotidine (PEPCID) IV     remdesivir 100 mg in NS 100 mL 100 mg (01/29/19 0936)    sodium bicarbonate (isotonic) infusion in sterile water 75 mL/hr at 01/29/19 1203     LOS: 1 day    Time spent: over 30 min    Fayrene Helper, MD [facility] Pager AMION  If 7PM-7AM, please contact night-coverage [email or web address] Password [email or web address] 01/29/2019, 1:38 PM

## 2019-01-29 NOTE — Progress Notes (Addendum)
Initial Nutrition Assessment  DOCUMENTATION CODES:   Not applicable  INTERVENTION:    Ensure Enlive po TID, each supplement provides 350 kcal and 20 grams of protein  NUTRITION DIAGNOSIS:   Increased nutrient needs related to acute illness(COVID-19 PNA) as evidenced by estimated needs.  GOAL:   Patient will meet greater than or equal to 90% of their needs  MONITOR:   PO intake, Supplement acceptance, Weight trends, Labs, I & O's  REASON FOR ASSESSMENT:   Malnutrition Screening Tool    ASSESSMENT:   Patient with PMH significant for CKD stage IV, CAD, HLD, and PAF. Presents this admission with COVID-19 PNA.   Unable to obtain history from pt given confusion. Per H&P, pt's appetite overall was poor PTA. No meal completions charted at this time. Will need to obtain history if possible. RD to provide supplementation to maximize kcal and protein.     Records indicate pt weighed 59.9 kg on 8/21 and 52.3 kg this admission (12.7% wt loss in 4 months, significant for time frame). Suspect pt is malnourished but unable to diagnose without diet recall and physical exam.   I/O: +50 ml since admit UOP: 300 ml x 24 hrs    Drips: sodium bicarb Medications: solumedrol, remeron, 500 mg Vit C, 220 mg zinc sulfate Labs: Phosphorus 6.4 (H) Cr 3.59- trending down   Diet Order:   Diet Order            Diet Heart Room service appropriate? Yes; Fluid consistency: Thin  Diet effective now              EDUCATION NEEDS:   Not appropriate for education at this time  Skin:  Skin Assessment: Reviewed RN Assessment  Last BM:  PTA  Height:   Ht Readings from Last 1 Encounters:  01/08/19 5\' 6"  (1.676 m)    Weight:   Wt Readings from Last 1 Encounters:  01/29/19 52.3 kg    Ideal Body Weight:  64.5 kg  BMI:  Body mass index is 18.61 kg/m.  Estimated Nutritional Needs:   Kcal:  1700-1900 kcal  Protein:  85-100 grams  Fluid:  >/= 1.7 L/day   Mariana Single RD,  LDN Clinical Nutrition Pager # - [phone]

## 2019-01-29 NOTE — Progress Notes (Signed)
New admit overnight. Pt aox3 with questions but confusion is noted in conversation. No complaint other than sore neck. Remains on [place] and cardiazem gtt. Reduced this am for hypotension.

## 2019-01-30 DIAGNOSIS — E7800 Pure hypercholesterolemia, unspecified: Secondary | ICD-10-CM

## 2019-01-30 DIAGNOSIS — A419 Sepsis, unspecified organism: Secondary | ICD-10-CM

## 2019-01-30 DIAGNOSIS — I5033 Acute on chronic diastolic (congestive) heart failure: Secondary | ICD-10-CM | POA: Diagnosis not present

## 2019-01-30 DIAGNOSIS — Z66 Do not resuscitate: Secondary | ICD-10-CM | POA: Diagnosis present

## 2019-01-30 DIAGNOSIS — I2510 Atherosclerotic heart disease of native coronary artery without angina pectoris: Secondary | ICD-10-CM

## 2019-01-30 DIAGNOSIS — E872 Acidosis: Secondary | ICD-10-CM

## 2019-01-30 DIAGNOSIS — Z87891 Personal history of nicotine dependence: Secondary | ICD-10-CM

## 2019-01-30 DIAGNOSIS — D638 Anemia in other chronic diseases classified elsewhere: Secondary | ICD-10-CM

## 2019-01-30 DIAGNOSIS — M542 Cervicalgia: Secondary | ICD-10-CM

## 2019-01-30 DIAGNOSIS — I10 Essential (primary) hypertension: Secondary | ICD-10-CM

## 2019-01-30 DIAGNOSIS — E538 Deficiency of other specified B group vitamins: Secondary | ICD-10-CM | POA: Diagnosis present

## 2019-01-30 DIAGNOSIS — R7303 Prediabetes: Secondary | ICD-10-CM

## 2019-01-30 LAB — CBC WITH DIFFERENTIAL/PLATELET
Abs Immature Granulocytes: 0.03 10*3/uL (ref 0.00–0.07)
Basophils Absolute: 0 10*3/uL (ref 0.0–0.1)
Basophils Relative: 0 %
Eosinophils Absolute: 0 10*3/uL (ref 0.0–0.5)
Eosinophils Relative: 0 %
HCT: 25.7 % — ABNORMAL LOW (ref 39.0–52.0)
Hemoglobin: 8.5 g/dL — ABNORMAL LOW (ref 13.0–17.0)
Immature Granulocytes: 1 %
Lymphocytes Relative: 8 %
Lymphs Abs: 0.3 10*3/uL — ABNORMAL LOW (ref 0.7–4.0)
MCH: 34.6 pg — ABNORMAL HIGH (ref 26.0–34.0)
MCHC: 33.1 g/dL (ref 30.0–36.0)
MCV: 104.5 fL — ABNORMAL HIGH (ref 80.0–100.0)
Monocytes Absolute: 0.2 10*3/uL (ref 0.1–1.0)
Monocytes Relative: 5 %
Neutro Abs: 3.7 10*3/uL (ref 1.7–7.7)
Neutrophils Relative %: 86 %
Platelets: 176 10*3/uL (ref 150–400)
RBC: 2.46 MIL/uL — ABNORMAL LOW (ref 4.22–5.81)
RDW: 15 % (ref 11.5–15.5)
WBC: 4.3 10*3/uL (ref 4.0–10.5)
nRBC: 0 % (ref 0.0–0.2)

## 2019-01-30 LAB — MAGNESIUM: Magnesium: 1.7 mg/dL (ref 1.7–2.4)

## 2019-01-30 LAB — PREPARE FRESH FROZEN PLASMA: Unit division: 0

## 2019-01-30 LAB — COMPREHENSIVE METABOLIC PANEL
ALT: 10 U/L (ref 0–44)
AST: 17 U/L (ref 15–41)
Albumin: 2.3 g/dL — ABNORMAL LOW (ref 3.5–5.0)
Alkaline Phosphatase: 77 U/L (ref 38–126)
Anion gap: 12 (ref 5–15)
BUN: 80 mg/dL — ABNORMAL HIGH (ref 8–23)
CO2: 28 mmol/L (ref 22–32)
Calcium: 6.4 mg/dL — CL (ref 8.9–10.3)
Chloride: 103 mmol/L (ref 98–111)
Creatinine, Ser: 3.22 mg/dL — ABNORMAL HIGH (ref 0.61–1.24)
GFR calc Af Amer: 18 mL/min — ABNORMAL LOW (ref >60)
GFR calc non Af Amer: 16 mL/min — ABNORMAL LOW (ref >60)
Glucose, Bld: 227 mg/dL — ABNORMAL HIGH (ref 70–99)
Potassium: 3.9 mmol/L (ref 3.5–5.1)
Sodium: 143 mmol/L (ref 135–145)
Total Bilirubin: 0.5 mg/dL (ref 0.3–1.2)
Total Protein: 5.3 g/dL — ABNORMAL LOW (ref 6.5–8.1)

## 2019-01-30 LAB — GLUCOSE, CAPILLARY
Glucose-Capillary: 156 mg/dL — ABNORMAL HIGH (ref 70–99)
Glucose-Capillary: 155 mg/dL — ABNORMAL HIGH (ref 70–99)

## 2019-01-30 LAB — BPAM FFP
Blood Product Expiration Date: 202012092048
ISSUE DATE / TIME: 202012082052
Unit Type and Rh: 5100

## 2019-01-30 LAB — C-REACTIVE PROTEIN: CRP: 12.9 mg/dL — ABNORMAL HIGH (ref <1.0)

## 2019-01-30 LAB — FERRITIN: Ferritin: 489 ng/mL — ABNORMAL HIGH (ref 24–336)

## 2019-01-30 LAB — D-DIMER, QUANTITATIVE: D-Dimer, Quant: 3.11 ug/mL-FEU — ABNORMAL HIGH (ref 0.00–0.50)

## 2019-01-30 LAB — PHOSPHORUS: Phosphorus: 4.3 mg/dL (ref 2.5–4.6)

## 2019-01-30 MED ORDER — CYANOCOBALAMIN 1000 MCG/ML IJ SOLN
1000.0000 ug | Freq: Every day | INTRAMUSCULAR | Status: DC
  Administered 2019-01-30: 1000 ug via INTRAMUSCULAR
  Filled 2019-01-30 (×2): qty 1

## 2019-01-30 MED ORDER — MAGNESIUM SULFATE 2 GM/50ML IV SOLN
2.0000 g | Freq: Once | INTRAVENOUS | Status: AC
  Administered 2019-01-30: 2 g via INTRAVENOUS
  Filled 2019-01-30: qty 50

## 2019-01-30 MED ORDER — DIPHENHYDRAMINE HCL 50 MG/ML IJ SOLN: 12.5000 mg | INTRAMUSCULAR | Status: DC | PRN

## 2019-01-30 MED ORDER — INSULIN ASPART 100 UNIT/ML ~~LOC~~ SOLN
0.0000 [IU] | Freq: Three times a day (TID) | SUBCUTANEOUS | Status: DC
  Administered 2019-01-30: 2 [IU] via SUBCUTANEOUS

## 2019-01-30 MED ORDER — LEVALBUTEROL TARTRATE 45 MCG/ACT IN AERO
6.0000 | INHALATION_SPRAY | Freq: Four times a day (QID) | RESPIRATORY_TRACT | Status: DC
  Administered 2019-01-30: 6 via RESPIRATORY_TRACT
  Filled 2019-01-30: qty 15

## 2019-01-30 MED ORDER — LORAZEPAM 2 MG/ML PO CONC: 1.0000 mg | ORAL | Status: DC | PRN

## 2019-01-30 MED ORDER — LORAZEPAM 0.5 MG PO TABS: 1.0000 mg | ORAL_TABLET | ORAL | Status: DC | PRN

## 2019-01-30 MED ORDER — INSULIN ASPART 100 UNIT/ML ~~LOC~~ SOLN: 0.0000 [IU] | Freq: Every day | SUBCUTANEOUS | Status: DC

## 2019-01-30 MED ORDER — LORAZEPAM 2 MG/ML IJ SOLN: 1.0000 mg | INTRAMUSCULAR | Status: DC | PRN

## 2019-01-30 MED ORDER — ACETAMINOPHEN 325 MG PO TABS: 650.0000 mg | ORAL_TABLET | Freq: Four times a day (QID) | ORAL | Status: DC | PRN

## 2019-01-30 MED ORDER — METHYLPREDNISOLONE SODIUM SUCC 40 MG IJ SOLR
40.0000 mg | Freq: Three times a day (TID) | INTRAMUSCULAR | Status: DC
  Administered 2019-01-30: 40 mg via INTRAVENOUS
  Filled 2019-01-30: qty 1

## 2019-01-30 MED ORDER — HALOPERIDOL LACTATE 5 MG/ML IJ SOLN: 0.5000 mg | INTRAMUSCULAR | Status: DC | PRN

## 2019-01-30 MED ORDER — METHYLPREDNISOLONE SODIUM SUCC 40 MG IJ SOLR: 40.0000 mg | Freq: Two times a day (BID) | INTRAMUSCULAR | Status: DC

## 2019-01-30 MED ORDER — FUROSEMIDE 10 MG/ML IJ SOLN
40.0000 mg | Freq: Once | INTRAMUSCULAR | Status: AC
  Administered 2019-01-30: 40 mg via INTRAVENOUS
  Filled 2019-01-30: qty 4

## 2019-01-30 MED ORDER — HALOPERIDOL LACTATE 2 MG/ML PO CONC
0.5000 mg | ORAL | Status: DC | PRN
  Filled 2019-01-30: qty 0.3

## 2019-01-30 MED ORDER — MORPHINE SULFATE (PF) 2 MG/ML IV SOLN
1.0000 mg | INTRAVENOUS | Status: DC | PRN
  Administered 2019-01-30 – 2019-01-31 (×4): 1 mg via INTRAVENOUS
  Filled 2019-01-30 (×4): qty 1

## 2019-01-30 MED ORDER — HALOPERIDOL 0.5 MG PO TABS
0.5000 mg | ORAL_TABLET | ORAL | Status: DC | PRN
  Filled 2019-01-30: qty 1

## 2019-01-30 MED ORDER — LORAZEPAM 2 MG/ML IJ SOLN
0.5000 mg | Freq: Four times a day (QID) | INTRAMUSCULAR | Status: DC | PRN
  Administered 2019-01-30: 0.5 mg via INTRAVENOUS
  Filled 2019-01-30: qty 1

## 2019-01-30 MED ORDER — ACETAMINOPHEN 650 MG RE SUPP: 650.0000 mg | Freq: Four times a day (QID) | RECTAL | Status: DC | PRN

## 2019-01-30 NOTE — Progress Notes (Signed)
Family visit set up and patient's son was able to visit with him. This RN spoke to patient's daughter and she has been updated on patient's condition.

## 2019-01-30 NOTE — Progress Notes (Addendum)
Pt had a somewhat eventful night in the middle of and toward the end of the shift. Started with confusion when pt reported rainfall with rain drops falling on to his bed. Pt reoriented; but pt still did not believe that there was no rain falling. Pt also had some hallucination as he verbalized seeing a creek with water flowing and calling on a family member to take him to his chair. O2 sats dropped consistently in the 80's when pt became restless and started to pull on his catheter and IV lines to even as low as 77% but did not sustain  in the 70's. O2 increased to as high as 15% on HFNC to maintain sats greater than 90%. Towards the end of the shift, changed to NRB  after pt tried getting out of bed and  sats had dropped to the 80's and took a while to get back up to the 90's. Once resting and quiet, pt sats remained wnl and even as high as 95% on 8 liters; otherwise sats continued to drop with pt's  frequent activities of restlessness/movements.

## 2019-01-30 NOTE — Progress Notes (Signed)
Notified MD of transfer to [id number]

## 2019-01-30 NOTE — Progress Notes (Signed)
Called to check patient who had sats in the 70's.  Placed patient on 15 L HFNC in addition to NRB at 15 L.  Patient began to stabilize in the upper 80's.

## 2019-01-30 NOTE — Progress Notes (Signed)
Update this evening: Called by RN regarding advancing hypoxia despite escalation to salter HFNC and NRB both maxed out at 15L. The patient is intermittently agitated, but on my presentation is calm with bilateral soft wrist restraints. He's calling the RN "Audry Pili" who is his relative. SpO2 remained 84-88%, no accessory muscle use. Lung sounds remain coarse throughout. No wheezing currently.   Based on his continued decompensation, my assessment is that this patient's clinical outcome will be grim no matter what interventions we pursue. Based on our conversation earlier, I've called his daughter again and relayed that information. I will ask the charge nurse to reach out to her to see if she or someone else would be interested in an in-person visitation this evening. Will give low dose ativan prn agitation.   Once family has visited or made the decision not to visit, I would recommend conversion to complete comfort measures with discontinuation of all monitoring and supplemental oxygen after starting dilaudid infusion.  Vance Gather, MD 01/30/2019 7:25 PM

## 2019-01-30 NOTE — Consult Note (Signed)
   [facility] Inpatient Consult   01/30/2019  [name] [birth year] [id number]    Patient was evaluated for 34% extreme high risk score for unplanned readmission and hospitalizations under his [facility] benefits.  Primary care provider is Dr. Wenda Low with [facility], listed to provide transition of care follow-up  Review of medical record shows on MD brief narrative dated 01/29/19 as follows: James Manson Purcellis a 85 y.o.malewith medical history significant ofPAFibnot onanticoagulation (due to increased risk of falls),CKD stage IV, CAD, and hyperlipidemia,   presented with complaints of nonproductive cough and weakness for the last 5 days. Multiple family members came backpositive for Covid all at once and had exposureto the patient.    Admitted for respiratory failure with hypoxiasecondary to pneumoniadueto COVID-19 virusand sepsis.  Follow for progress and disposition recommendations/ needs with the Inpatient TOC team.  Disposition plan: pending further improvement.  Of note, [name] Care Management services does not replace or interfere with any services that are needed or arranged by inpatient [facility] care management team.  Please refer to [facility] care management if there are needs appropriate for community follow-up.    For additional questions or referrals please contact:  Edwena Felty A. Tamecia Mcdougald, BSN, RN-BC [facility] Liaison Cell: [phone]

## 2019-01-30 NOTE — Progress Notes (Signed)
CRITICAL VALUE ALERT  Critical Value:  Calcium 6.4  Date & Time Notied:  12-10/2018 0420  Provider Notified: Dr Adefeso/Primary RN  Orders Received/Actions taken: Awaiting new orders

## 2019-01-30 NOTE — Progress Notes (Addendum)
PROGRESS NOTE  SOPHIE QUILES  FFM:[id number] DOB: December 26, 1923 DOA: 02/12/2019 PCP: Wenda Low, MD   Brief Narrative: DAREY HERSHBERGER is a 85 y.o. male with a history of PAF, stage IV CKD, CAD, and HLD who presented to the ED 12/7 with cough and weakness after covid-19 exposures. He normally ambulates with the use of a walker, but become so weak that he was unable to stand after using the restroom. His appetite overall is poor, but he has been keeping hydrated. Denies having any significant fever, chills, nausea, vomiting, diarrhea, chest pain, or dysuria. His daughter makes note that he was stopped off anticoagulation over the summer due to increased risk of falls.  In the ED he was tachypneic, hypoxic requiring 4L O2. WBC 16.6k, hgb 9.8, BUN 63, Cr 3.72. Lactic acid elevated at 2.7, CRP was elevated at 16.7, as were all other inflammatory markers. SARS-CoV-2 PCR confirmed positive and CXR demonstrated bilateral pneumonia. He was given remdesivir and steroids, admitted to [facility], and later given convalescent plasma on 12/8. With elevated PCT and leukocytosis on admission, antibiotics were also added. Bicarbonate infusion was given due to AKI w/metabolic acidosis, with subsequent improvement in creatinine and resolution of lactic acidosis. Diltiazem infusion has been required for rate control.    Assessment & Plan: Principal Problem:   Pneumonia due to COVID-19 virus Active Problems:   Hyperlipidemia   Anemia   HTN (hypertension)   Sepsis ([place])   Acute respiratory failure with hypoxia (HCC)   Neck pain   Lactic acidosis   DNR (do not resuscitate)  Acute hypoxic respiratory failure due to covid-19, possible superimposed CAP: CXR 12/8 repeated shows rapid progression of extensive bilateral hazy pulmonary infiltrates consistent with bilateral pneumonia. Lung sounds are very coarse. - Continue remdesivir x5 days.  - Continue steroids - s/p CCP 12/8 - Continue ceftriaxone, azithromycin  for CAP.  - Continue airborne, contact precautions. PPE including surgical gown, gloves, cap, shoe covers, and CAPR used during this encounter in a negative pressure room.  - Check daily labs: CBC w/diff, CMP, d-dimer, ferritin, CRP. CRP improved but remains grossly elevated at 12.9. Unable to give off-label tocilizumab in the setting of resolving sepsis due to CAP. Will augment steroid dosing.  - Prophylactic anticoagulation started - Avoid NSAIDs - Recommend proning and aggressive use of incentive spirometry. - Goals of care were discussed in the setting of this patient's multiorgan failure and worsening hypoxia, DNR present on admission. Prognosis is guarded.   Severe sepsis: Patient exhibited sepsis, lactic acidosis with AKI on admission due to CAP. Sepsis physiology has improved, leukocytosis resolved. - Blood cultures NGTD - Abx as above - Distribution of infiltrate not consistent with aspiration event, but very coarse breath sounds, worry for aspiration in his current state, so will get SLP evaluation. - Check urine culture, albeit after abx. Pt has history of Klebsiella UTI and is not currently able to relate symptoms.  Wheezing, history of tobacco use noted in chart:  - Augment steroids to TID. Change albuterol to xopenex to reduce impact on tachycardia, add spacer.   Acute on chronic HFpEF: BNP up, required bicarb gtt and now with significant crackles at bases.  - BP improved enough to give lasix x1. Will have to monitor I/O and renal function closely  AKI on stage IV CKD: improved modestly, with CrCl still only ~81ml/min, NAGMA resolved. Suspect now fluid overloaded. Must stop IVF and give lasix as respiratory failure is more imminently life-threatening than renal failure.  -  Avoid avoidable nephrotoxins - Monitor UOP and renal function panel - Urinalysis. R/o retention with bladder scan.  CAD: Followed by Dr. Radford Pax.  - Continue aspirin, beta blocker, statin  Persistent atrial  fibrillation with RVR, RBBB:  - Continue diltiazem gtt. (LVEF preserved on last echo). Will convert to po if stabilizing over next 12 hours.  - Continue home metoprolol po, BP tolerating.  - Anticoagulation stopped by outpatient providers due to fall risk. Will continue aspirin and heparin 5,000u [place] q8h.  - Supplement magnesium, level 1.7.  Anemia of chronic disease and of megaloblastic anemia of vitamin B12 deficiency:  - Continue monitoring. - B12 level recently low at 152, will supplement IM. Folate wnl.  HLD:  - Continue statin   HTN:  - Avoid hypotension  Severe protein-calorie malnutrition, hypoalbuminemia:  - Dietitian is consulted  Encounter for palliative care, goals of care discussion: Due to multiorgan failure in patient with baseline poor functional status and advanced age. Very high risk for deterioration.  Prediabetes, hyperglycemia:  - Check HbA1c - Start SSI to optimize glycemic control during severe illness with steroids on board  Acute metabolic encephalopathy: Also possibly hypoxic encephalopathy and acute delirium.  - Continue remeron qHS home medication - Delirium precautions - Minimize restraints as much as possible, but these may be necessary for patient safety.  DVT prophylaxis: Heparin Code Status: DNR, POA Family Communication: Daughter at length today. Disposition Plan: Uncertain  Consultants:   None  Procedures:   None  Antimicrobials:  Remdesivir  Ceftriaxone  Azithromycin   Subjective: Confused, had significant agitated delirium last night. Denies he's in any pain, picking at his mittens and not engaging with me.  Objective: Vitals:   01/30/19 0500 01/30/19 0730 01/30/19 1002 01/30/19 1230  BP:  103/73  101/80  Pulse:  (!) 103  (!) 105  Resp:  18  20  Temp:  97.7 F (36.5 C)  98.1 F (36.7 C)  TempSrc:  Oral  Oral  SpO2:  92% 93% 92%  Weight: 52.3 kg       Intake/Output Summary (Last 24 hours) at 01/30/2019 1512 Last  data filed at 01/30/2019 0557 Gross per 24 hour  Intake 1802.92 ml  Output 750 ml  Net 1052.92 ml   Filed Weights   01/29/19 0500 01/30/19 0500  Weight: 52.3 kg 52.3 kg    Gen: Elderly male in no acute distress Pulm: Non-labored breathing NRB. Very coarse throughout, inspiratory crackles, expiratory wheezes.  CV: Irreg, rate ~100bpm. No murmur, rub, or gallop. No JVD, trace dependent edema. GI: Abdomen soft, non-tender, non-distended, with normoactive bowel sounds. No organomegaly or masses felt. Ext: Warm, no deformities, dry, decreased muscle bulk. Skin: No rashes, lesions or ulcers on visualized skin Neuro: Alert, not oriented nor cooperative with exam. Moves all extremities. Psych: Judgement and insight appear impaired.  Data Reviewed: I have personally reviewed following labs and imaging studies  CBC: Recent Labs  Lab 02/19/2019 1134 01/29/19 0845 01/30/19 0000  WBC 16.6* 4.9 4.3  NEUTROABS 13.6* 4.4 3.7  HGB 9.8* 8.6* 8.5*  HCT 32.7* 27.3* 25.7*  MCV 113.9* 107.9* 104.5*  PLT 238 167 619   Basic Metabolic Panel: Recent Labs  Lab 02/11/2019 1134 01/29/19 0845 01/30/19 0000  NA 141 143 143  K 4.3 4.5 3.9  CL 114* 118* 103  CO2 11* 11* 28  GLUCOSE 125* 121* 227*  BUN 63* 70* 80*  CREATININE 3.72* 3.59* 3.22*  CALCIUM 7.8* 7.5* 6.4*  MG  --  1.8  1.7  PHOS  --  6.4* 4.3   GFR: Estimated Creatinine Clearance: 10.6 mL/min (A) (by C-G formula based on SCr of 3.22 mg/dL (H)). Liver Function Tests: Recent Labs  Lab 01/24/2019 1134 01/29/19 0845 01/30/19 0000  AST 20 18 17   ALT 11 10 10   ALKPHOS 86 68 77  BILITOT 1.0 0.7 0.5  PROT 6.6 5.5* 5.3*  ALBUMIN 2.6* 2.2* 2.3*   No results for input(s): LIPASE, AMYLASE in the last 168 hours. No results for input(s): AMMONIA in the last 168 hours. Coagulation Profile: Recent Labs  Lab 02/10/2019 1346  INR 1.3*   Cardiac Enzymes: No results for input(s): CKTOTAL, CKMB, CKMBINDEX, TROPONINI in the last 168 hours.  BNP (last 3 results) No results for input(s): PROBNP in the last 8760 hours. HbA1C: No results for input(s): HGBA1C in the last 72 hours. CBG: No results for input(s): GLUCAP in the last 168 hours. Lipid Profile: Recent Labs    02/09/2019 1346  TRIG 62   Thyroid Function Tests: No results for input(s): TSH, T4TOTAL, FREET4, T3FREE, THYROIDAB in the last 72 hours. Anemia Panel: Recent Labs    01/29/19 0845 01/30/19 0000  FERRITIN 560* 489*   Urine analysis:    Component Value Date/Time   COLORURINE YELLOW 01/08/2019 1326   APPEARANCEUR TURBID (A) 01/08/2019 1326   LABSPEC 1.011 01/08/2019 1326   PHURINE 5.0 01/08/2019 1326   GLUCOSEU NEGATIVE 01/08/2019 1326   HGBUR MODERATE (A) 01/08/2019 1326   BILIRUBINUR NEGATIVE 01/08/2019 1326   KETONESUR NEGATIVE 01/08/2019 1326   PROTEINUR 100 (A) 01/08/2019 1326   UROBILINOGEN 1.0 03/28/2008 1212   NITRITE NEGATIVE 01/08/2019 1326   LEUKOCYTESUR LARGE (A) 01/08/2019 1326   Recent Results (from the past 240 hour(s))  Blood Culture (routine x 2)     Status: None (Preliminary result)   Collection Time: 02/21/2019  2:13 PM   Specimen: BLOOD RIGHT FOREARM  Result Value Ref Range Status   Specimen Description BLOOD RIGHT FOREARM  Final   Special Requests   Final    BOTTLES DRAWN AEROBIC AND ANAEROBIC Blood Culture results may not be optimal due to an inadequate volume of blood received in culture bottles   Culture   Final    NO GROWTH 2 DAYS Performed at [facility], [place] [address] [place], [place] [address]    Report Status PENDING  Incomplete  Blood Culture (routine x 2)     Status: None (Preliminary result)   Collection Time: 02/04/2019  2:13 PM   Specimen: BLOOD  Result Value Ref Range Status   Specimen Description BLOOD LEFT ANTECUBITAL  Final   Special Requests   Final    BOTTLES DRAWN AEROBIC AND ANAEROBIC Blood Culture results may not be optimal due to an excessive volume of blood received in culture bottles    Culture   Final    NO GROWTH 2 DAYS Performed at [facility], [place] [address], [place], [place] [address]    Report Status PENDING  Incomplete      Radiology Studies: Dg Chest Port 1 View  Result Date: 01/29/2019 CLINICAL DATA:  Hypoxia. COVID pneumonia. EXAM: PORTABLE CHEST 1 VIEW COMPARISON:  01/25/2019 and 03/28/2008 FINDINGS: There has been rapid progression of extensive bilateral hazy pulmonary infiltrates. Heart size and vascularity are normal. Chronic elevation of the left hemidiaphragm. No significant bone abnormality. IMPRESSION: 1. Rapid progression of extensive bilateral hazy pulmonary infiltrates consistent with bilateral pneumonia. No pleural effusion or pneumothorax. 2. Chronic elevation  of the left hemidiaphragm. Electronically Signed   By: Lorriane Shire M.D.   On: 01/29/2019 08:40    Scheduled Meds: . aspirin EC  81 mg Oral Daily  . azithromycin  500 mg Oral Daily  . feeding supplement (ENSURE ENLIVE)  237 mL Oral TID BM  . heparin  5,000 Units Subcutaneous Q8H  . levalbuterol  6 puff Inhalation Q6H  . lidocaine  1 patch Transdermal Q24H  . methylPREDNISolone (SOLU-MEDROL) injection  40 mg Intravenous Q8H  . metoprolol tartrate  50 mg Oral BID  . mirtazapine  15 mg Oral QHS  . simvastatin  10 mg Oral QHS  . sodium chloride flush  3 mL Intravenous Once  . vitamin C  500 mg Oral Daily  . zinc sulfate  220 mg Oral Daily   Continuous Infusions: . cefTRIAXone (ROCEPHIN)  IV 2 g (01/30/19 0927)  . dilTIAZem HCl-Dextrose 5 mg/hr (01/29/19 1648)  . famotidine (PEPCID) IV 20 mg (01/29/19 2118)  . remdesivir 100 mg in NS 100 mL 100 mg (01/30/19 1006)     LOS: 2 days   Time spent: 35 minutes.  Patrecia Pour, MD [facility] [email or web address] 01/30/2019, 3:12 PM

## 2019-01-31 DIAGNOSIS — I4819 Other persistent atrial fibrillation: Secondary | ICD-10-CM

## 2019-01-31 DIAGNOSIS — J1289 Other viral pneumonia: Secondary | ICD-10-CM

## 2019-01-31 DIAGNOSIS — Z66 Do not resuscitate: Secondary | ICD-10-CM

## 2019-01-31 DIAGNOSIS — U071 COVID-19: Secondary | ICD-10-CM

## 2019-01-31 DIAGNOSIS — I5033 Acute on chronic diastolic (congestive) heart failure: Secondary | ICD-10-CM

## 2019-01-31 DIAGNOSIS — J9601 Acute respiratory failure with hypoxia: Secondary | ICD-10-CM

## 2019-01-31 DIAGNOSIS — Z515 Encounter for palliative care: Secondary | ICD-10-CM

## 2019-01-31 MED ORDER — ONDANSETRON 4 MG PO TBDP: 4.0000 mg | ORAL_TABLET | Freq: Four times a day (QID) | ORAL | Status: DC | PRN

## 2019-01-31 MED ORDER — GLYCOPYRROLATE 0.2 MG/ML IJ SOLN: 0.2000 mg | INTRAMUSCULAR | Status: DC | PRN

## 2019-01-31 MED ORDER — ONDANSETRON HCL 4 MG/2ML IJ SOLN: 4.0000 mg | Freq: Four times a day (QID) | INTRAMUSCULAR | Status: DC | PRN

## 2019-01-31 MED ORDER — GLYCOPYRROLATE 1 MG PO TABS
1.0000 mg | ORAL_TABLET | ORAL | Status: DC | PRN
  Filled 2019-01-31: qty 1

## 2019-01-31 MED ORDER — BIOTENE DRY MOUTH MT LIQD: 15.0000 mL | OROMUCOSAL | Status: DC | PRN

## 2019-01-31 MED ORDER — HYDROMORPHONE HCL 1 MG/ML IJ SOLN: 0.5000 mg | INTRAMUSCULAR | Status: DC | PRN

## 2019-01-31 MED ORDER — HYDROMORPHONE HCL 1 MG/ML IJ SOLN
0.5000 mg | INTRAMUSCULAR | Status: DC | PRN
  Administered 2019-01-31 – 2019-02-01 (×5): 0.5 mg via INTRAVENOUS
  Filled 2019-01-31 (×5): qty 1

## 2019-01-31 MED ORDER — POLYVINYL ALCOHOL 1.4 % OP SOLN
1.0000 [drp] | Freq: Four times a day (QID) | OPHTHALMIC | Status: DC | PRN
  Filled 2019-01-31: qty 15

## 2019-01-31 NOTE — Progress Notes (Signed)
PMT consult received and chart reviewed. Discussed with Dr. Bonner Puna and RN, Lavella Lemons.   James Strong is a 85yo male with history of PAF recently taken off anticoagulation secondary to falls, stage IV CKD, CAD, HLD, DNR on admission presented to ED 12/7 with cough, weakness, and covid 19 exposure. Hospital admission for severe sepsis with lactic acidosis, acute metabolic encephalopathy, acute on chronic renal failure, acute hypoxic respiratory failure due to [id number] pneumonia complicated by CAP, afib RVR, and acute on chronic diastolic heart failure. Despite maximum medical management for conditions, patient remained encephalopathic requiring restraints and worsening hypoxia requiring 15L NRB and 15L HFNC. DNR. Dr. Bonner Puna discussed goals of care with family. Decision made for transition to comfort measures only.   Per RN report, patient appears comfortable with no s/s of distress. His oxygen saturation is 75% on 2L. He will open eyes to sternal rub, otherwise lethargic but again comfortable. PRN comfort medications on MAR.   Spoke with daughter, [name] via telephone. [name] and her 7 siblings understand their father's critical condition and poor prognosis. [name] confirms plan for comfort measures and understands he will pass in the hospital. She states "we don't want him to be in pain." Educated on comfort focused care and medications on MAR to ensure he has peace, comfort, dignity at EOL. Educated on EOL expectations and poor prognosis. [name] shares that she has covid and unable to visit. One of her brother's visited yesterday, but the rest of her siblings do not plan to visit due to the pandemic. Answered all questions for [name] and emotional/spiritual support provided.   Total time spent: 25 minutes  The above conversation was completed via telephone due to visitor restrictions during COVID-19 pandemic. Thorough chart review and discussion with multidisciplinary team was completed as part of assessment. No physical  examination was performed.   Ihor Dow, DNP, FNP-C Palliative Medicine Team  Phone: [phone] Fax: [phone]

## 2019-01-31 NOTE — Progress Notes (Signed)
PROGRESS NOTE  Brief Narrative: James Strong is a 85 y.o. male with a history of PAF recently taken off antiocagulation due to falls, stage IV CKD, CAD, HLD, DNR on admission who presented to the ED 12/7 with cough and weakness after covid-19 exposures. He normally ambulates with the use of a walker, but become so weak that he was unable to stand after using the restroom.  In the ED he was encephalopathic, severely malnourished, tachypneic, hypoxic requiring 4L O2. WBC 16.6k, hgb 9.8, BUN 63, Cr 3.72. Lactic acid elevated at 2.7, CRP was elevated at 16.7, as were all other inflammatory markers. SARS-CoV-2 PCR confirmed positive and CXR demonstrated bilateral pneumonia. He was admitted for severe sepsis with lactic acidosis, acute metabolic encephalopathy, acute on chronic renal failure, acute hypoxic respiratory failure due to [id number]-42 pneumonia complicated by community-acquired pneumonia. IV fluids initially given with only modest improvement in renal function and development of acute on chronic diastolic heart failure. The patient also developed rapid ventricular response to atrial fibrillation complicated by hypotension requiring diltiazem infusion. He was given remdesivir and steroids, admitted to [facility], and later given convalescent plasma on 12/8. With elevated PCT and leukocytosis on admission, antibiotics were also added. Despite these efforts, he remained encephalopathic requiring restraints and grew severely hypoxic requiring 15L NRB with 15L HFNC. Known to be a DNR, on 12/9, in the setting of progressive multiorgan failure in a chronically ill patient of advanced age, goals of care discussions were continued and family visitation recommended. After family had time to visit with the patient, he was made full comfort measures.  Subjective: Now appears much more at ease, no restlessness, no signs of dyspnea.   Objective: BP 104/60 (BP Location: Right Arm)   Pulse (!) 105   Temp 97.8 F (36.6  C) (Axillary)   Resp (!) 24   Wt 52.3 kg   SpO2 (!) 60%   BMI 18.61 kg/m   Gen: Elderly frail male in no distress Pulm: Coarse  CV: Rapid irregular, no LE edema GI: Soft, NT, ND, +BS  Neuro: Lethargic, poorly responsive.  Skin: No new rashes or ulcers  Assessment & Plan: Acute hypoxic respiratory failure due to covid-19, possible superimposed CAP: CXR 12/8 repeated shows rapid progression of extensive bilateral hazy pulmonary infiltrates consistent with bilateral pneumonia. Lung sounds are very coarse. - Not improved with remdesivir, steroids, convalescent plasma, antibiotics, and trial of lasix. Give dilaudid prn for air hunger based on extensive goals of care discussions with family.    Severe sepsis: Patient exhibited sepsis, lactic acidosis with AKI on admission due to CAP. Sepsis physiology has improved, leukocytosis resolved. - Blood cultures NG3D - No further Tx planned  Wheezing, history of tobacco use noted in chart: No further evaluation or treatment recommended.  - Continue inhaler therapy prn.  Acute on chronic HFpEF: BNP up, required bicarb gtt and subsequently significant crackles at bases. Trial of lasix did not improve respiratory status at all.   AKI on stage IV CKD: No significant improvement, CrCl ~61ml/min, NAGMA resolved with IVF. No further evaluations planned.  - Will change to dilaudid instead of morphine if needed.   CAD: Followed by Dr. Radford Pax. Has not had evidence of acute coronary syndrome. Will stop home medications as they present unwarranted aspiration risk and will not aid in comfort delivery.  Persistent atrial fibrillation with RVR, RBBB: Difficult to control, requiring infusions. No further treatment planned.   Anemia of chronic disease and of megaloblastic anemia of vitamin B12 deficiency:  We did supplement B12, folate normal. No further CBC checks.   HLD: No further treatment planned.  Essential hypertension with hypotension: No  further treatment planned.  Severe protein-calorie malnutrition, hypoalbuminemia: Worsens prognosis.  Encounter for palliative care, goals of care discussion: Due to multiorgan failure in patient with baseline poor functional status and advanced age, goals of care discussions were continued throughout hospitalization.  Prediabetes, hyperglycemia: No further evaluation planned.   Acute metabolic encephalopathy: Also possibly hypoxic encephalopathy and acute delirium.  - Avoid restraints, treat agitation/anxiety.  Patrecia Pour, MD Pager on amion 01/31/2019, 1:15 PM

## 2019-02-01 DIAGNOSIS — I4891 Unspecified atrial fibrillation: Secondary | ICD-10-CM

## 2019-02-01 DIAGNOSIS — G9341 Metabolic encephalopathy: Secondary | ICD-10-CM

## 2019-02-01 DIAGNOSIS — N184 Chronic kidney disease, stage 4 (severe): Secondary | ICD-10-CM | POA: Diagnosis present

## 2019-02-01 DIAGNOSIS — E43 Unspecified severe protein-calorie malnutrition: Secondary | ICD-10-CM

## 2019-02-02 LAB — CULTURE, BLOOD (ROUTINE X 2)
Culture: NO GROWTH
Culture: NO GROWTH

## 2019-02-22 NOTE — Progress Notes (Addendum)
PROGRESS NOTE  Brief Narrative: James Strong is a 86 y.o. male with a history of PAF recently taken off antiocagulation due to falls, stage IV CKD, CAD, HLD, DNR on admission who presented to the ED 12/7 with cough and weakness after covid-19 exposures. He normally ambulates with the use of a walker, but become so weak that he was unable to stand after using the restroom.  In the ED he was encephalopathic, severely malnourished, tachypneic, hypoxic requiring 4L O2. WBC 16.6k, hgb 9.8, BUN 63, Cr 3.72. Lactic acid elevated at 2.7, CRP was elevated at 16.7, as were all other inflammatory markers. SARS-CoV-2 PCR confirmed positive and CXR demonstrated bilateral pneumonia. He was admitted for severe sepsis with lactic acidosis, acute metabolic encephalopathy, acute on chronic renal failure, acute hypoxic respiratory failure due to [id number] pneumonia complicated by community-acquired pneumonia. IV fluids initially given with only modest improvement in renal function and development of acute on chronic diastolic heart failure. The patient also developed rapid ventricular response to atrial fibrillation complicated by hypotension requiring diltiazem infusion. He was given remdesivir and steroids, admitted to [facility], and later given convalescent plasma on 12/8. With elevated PCT and leukocytosis on admission, antibiotics were also added. Despite these efforts, he remained encephalopathic requiring restraints and grew severely hypoxic requiring 15L NRB with 15L HFNC. Known to be a DNR, on 12/9, in the setting of progressive multiorgan failure in a chronically ill patient of advanced age, goals of care discussions were continued and family visitation recommended. After family had time to visit with the patient, he was made full comfort measures.  Subjective: Appears to be nearing end of life.  Objective: BP (!) 108/52 (BP Location: Right Arm)   Pulse (!) 115   Temp 98.7 F (37.1 C) (Axillary)   Resp 10   Wt  52.3 kg   SpO2 (!) 68%   BMI 18.61 kg/m   Gen: Elderly frail male in no distress, mouth agape Neuro: Nonresponsive.  Assessment & Plan: Acute hypoxic respiratory failure due to covid-19, possible superimposed CAP: CXR 12/8 repeated shows rapid progression of extensive bilateral hazy pulmonary infiltrates consistent with bilateral pneumonia. Lung sounds are very coarse. - Not improved with remdesivir, steroids, convalescent plasma, antibiotics, and trial of lasix. Give dilaudid prn for air hunger based on extensive goals of care discussions with family. Symptoms well-controlled. No changes to medications. Called daughter without answer.  Severe sepsis: Patient exhibited sepsis, lactic acidosis with AKI on admission due to CAP. Sepsis physiology has improved, leukocytosis resolved. - Blood cultures NG3D - No further Tx planned  Wheezing, history of tobacco use noted in chart: No further evaluation or treatment recommended.  - Continue inhaler therapy prn.  Acute on chronic HFpEF: BNP up, required bicarb gtt and subsequently significant crackles at bases. Trial of lasix did not improve respiratory status at all.   AKI on stage IV CKD: No significant improvement, CrCl ~47ml/min, NAGMA resolved with IVF. No further evaluations planned.  - Will change to dilaudid instead of morphine if needed.   CAD: Followed by Dr. Radford Pax. Has not had evidence of acute coronary syndrome. Will stop home medications as they present unwarranted aspiration risk and will not aid in comfort delivery.  Persistent atrial fibrillation with RVR, RBBB: Difficult to control, requiring infusions. No further treatment planned.   Anemia of chronic disease and of megaloblastic anemia of vitamin B12 deficiency: We did supplement B12, folate normal. No further CBC checks.   HLD: No further treatment planned.  Essential hypertension  with hypotension: No further treatment planned.  Severe protein-calorie  malnutrition, hypoalbuminemia: Worsens prognosis.  Encounter for palliative care, goals of care discussion: Due to multiorgan failure in patient with baseline poor functional status and advanced age, goals of care discussions were continued throughout hospitalization.  Prediabetes, hyperglycemia: No further evaluation planned.   Acute metabolic encephalopathy: Also possibly hypoxic encephalopathy and acute delirium.  - Avoid restraints, treat agitation/anxiety.  Patrecia Pour, MD Pager on amion Feb 11, 2019, 12:19 PM

## 2019-02-22 NOTE — Death Summary Note (Signed)
DEATH SUMMARY   Patient Details  Name: James Strong MRN: [id number] DOB: 07-25-1923  Admission/Discharge Information   Admit Date:  2019-02-13  Date of Death: Date of Death: 2019-02-17    Time of Death: Time of Death: 30   Length of Stay: 4  Referring Physician: Wenda Low, MD   Reason(s) for Hospitalization  Acute respiratory failure due to covid-19 pneumonia  Diagnoses  Preliminary cause of death: Covid-19 pneumonia Secondary Diagnoses (including complications and co-morbidities):  Principal Problem:   Pneumonia due to COVID-19 virus Active Problems:   CAD (coronary artery disease)   Prediabetes   Hyperlipidemia   Anemia of chronic disease   HTN (hypertension)   Persistent atrial fibrillation (HCC)   Terminal care   Severe sepsis ([place])   Acute respiratory failure with hypoxia ([place])   Neck pain   Lactic acidosis   DNR (do not resuscitate)   History of tobacco abuse   Acute on chronic heart failure with preserved ejection fraction (HFpEF) ([place])   Vitamin B12 deficiency   Palliative care by specialist   Severe protein-calorie malnutrition ([place])   Acute renal failure superimposed on stage 4 chronic kidney disease ([place])   Atrial fibrillation with RVR ([place])   Acute metabolic encephalopathy  Brief Hospital Course (including significant findings, care, treatment, and services provided and events leading to death)  James Strong J21 y.o.malewith a history of PAF recently taken off antiocagulation due to falls, stage IV CKD, CAD, HLD, DNR on admission who presented to the ED 2023-02-13 with cough and weakness after covid-19 exposures.He normally ambulates with the use of a walker, but become so weak that he was unable to stand after using the restroom.  In the ED he was encephalopathic, severely malnourished, tachypneic, hypoxic requiring 4L O2. WBC 16.6k, hgb 9.8, BUN 63, Cr 3.72. Lactic acid elevated at 2.7, CRP was elevated at 16.7, as were all other  inflammatory markers. SARS-CoV-2 PCR confirmed positive and CXR demonstrated bilateral pneumonia. He was admitted for severe sepsis with lactic acidosis, acute metabolic encephalopathy, acute on chronic renal failure, acute hypoxic respiratory failure due to [id number] pneumonia complicated by community-acquired pneumonia. IV fluids initially given with only modest improvement in renal function and development of acute on chronic diastolic heart failure. The patient also developed rapid ventricular response to atrial fibrillation complicated by hypotension requiring diltiazem infusion. He was given remdesivir and steroids, admitted to [facility], and later given convalescent plasma on 12/8. With elevated PCT and leukocytosis on admission, antibiotics were also added. Despite these efforts, he remained hemodynamically unstable, encephalopathic requiring restraints, and grew severely hypoxic requiring 15L NRB with 15L HFNC. Known to be a DNR, on 12/9, in the setting of progressive multiorgan failure in a chronically ill patient of advanced age, goals of care discussions were continued and family visitation recommended. After family had time to visit with the patient, he was made full comfort measures. He later died peacefully on 17-Feb-2023. I have notified his daughter, Jeannene Patella.  Pertinent Labs and Studies  Significant Diagnostic Studies DG CHEST PORT 1 VIEW  Result Date: 01/29/2019 CLINICAL DATA:  Hypoxia. COVID pneumonia. EXAM: PORTABLE CHEST 1 VIEW COMPARISON:  2019/02/13 and 03/28/2008 FINDINGS: There has been rapid progression of extensive bilateral hazy pulmonary infiltrates. Heart size and vascularity are normal. Chronic elevation of the left hemidiaphragm. No significant bone abnormality. IMPRESSION: 1. Rapid progression of extensive bilateral hazy pulmonary infiltrates consistent with bilateral pneumonia. No pleural effusion or pneumothorax. 2. Chronic elevation of the left hemidiaphragm. Electronically Signed  By:  Lorriane Shire M.D.   On: 01/29/2019 08:40   DG Chest Portable 1 View  Result Date: 01/27/2019 CLINICAL DATA:  COVID exposure and cough. Additional history provided: Productive cough, fever and neck pain with recent COVID exposure. EXAM: PORTABLE CHEST 1 VIEW COMPARISON:  Chest radiograph 03/28/2008 FINDINGS: Heart size within normal limits.  Aortic atherosclerosis. Chronic elevation of the left hemidiaphragm. Patchy ill-defined and interstitial opacities bilaterally, suspicious for pneumonia given provided history. No evidence of pneumothorax or sizable pleural effusion. No acute bony abnormality. Overlying cardiac monitoring leads. IMPRESSION: Patchy ill-defined and interstitial opacities within both lungs, suspicious for pneumonia given provided history. Radiographic follow-up to resolution recommended. Aortic atherosclerosis. Chronic elevation of the left hemidiaphragm. Electronically Signed   By: Kellie Simmering DO   On: 02/10/2019 13:09    Microbiology Recent Results (from the past 240 hour(s))  Blood Culture (routine x 2)     Status: None (Preliminary result)   Collection Time: 02/17/2019  2:13 PM   Specimen: BLOOD RIGHT FOREARM  Result Value Ref Range Status   Specimen Description BLOOD RIGHT FOREARM  Final   Special Requests   Final    BOTTLES DRAWN AEROBIC AND ANAEROBIC Blood Culture results may not be optimal due to an inadequate volume of blood received in culture bottles   Culture   Final    NO GROWTH 4 DAYS Performed at [facility], [place] [address] [place], [place] [address]    Report Status PENDING  Incomplete  Blood Culture (routine x 2)     Status: None (Preliminary result)   Collection Time: 02/09/2019  2:13 PM   Specimen: BLOOD  Result Value Ref Range Status   Specimen Description BLOOD LEFT ANTECUBITAL  Final   Special Requests   Final    BOTTLES DRAWN AEROBIC AND ANAEROBIC Blood Culture results may not be optimal due to an excessive volume of blood received in culture  bottles   Culture   Final    NO GROWTH 4 DAYS Performed at [facility], [place] [address] [place],  [address]    Report Status PENDING  Incomplete    Lab Basic Metabolic Panel: Recent Labs  Lab 01/31/2019 1134 01/29/19 0845 01/30/19 0000  NA 141 143 143  K 4.3 4.5 3.9  CL 114* 118* 103  CO2 11* 11* 28  GLUCOSE 125* 121* 227*  BUN 63* 70* 80*  CREATININE 3.72* 3.59* 3.22*  CALCIUM 7.8* 7.5* 6.4*  MG  --  1.8 1.7  PHOS  --  6.4* 4.3   Liver Function Tests: Recent Labs  Lab 01/23/2019 1134 01/29/19 0845 01/30/19 0000  AST 20 18 17   ALT 11 10 10   ALKPHOS 86 68 77  BILITOT 1.0 0.7 0.5  PROT 6.6 5.5* 5.3*  ALBUMIN 2.6* 2.2* 2.3*   No results for input(s): LIPASE, AMYLASE in the last 168 hours. No results for input(s): AMMONIA in the last 168 hours. CBC: Recent Labs  Lab 01/25/2019 1134 01/29/19 0845 01/30/19 0000  WBC 16.6* 4.9 4.3  NEUTROABS 13.6* 4.4 3.7  HGB 9.8* 8.6* 8.5*  HCT 32.7* 27.3* 25.7*  MCV 113.9* 107.9* 104.5*  PLT 238 167 176   Cardiac Enzymes: No results for input(s): CKTOTAL, CKMB, CKMBINDEX, TROPONINI in the last 168 hours. Sepsis Labs: Recent Labs  Lab 02/09/2019 1134 02/04/2019 1346 02/15/2019 1358 02/04/2019 1526 01/26/2019 1600 01/29/19 0845 01/30/19 0000  PROCALCITON  --  0.79  --  0.82  --   --   --  WBC 16.6*  --   --   --   --  4.9 4.3  LATICACIDVEN  --   --  2.7*  --  1.6  --   --     Procedures/Operations  None   Patrecia Pour 02/02/2019, 1:48 PM

## 2019-02-22 DEATH — deceased

## 2019-04-12 ENCOUNTER — Ambulatory Visit: Payer: Medicare Other | Admitting: Cardiology

## 2020-02-02 IMAGING — US US RENAL
1 series · 14 of 25 positions shown · non-contrast
Comparison: None.

CLINICAL DATA: Acute kidney injury.

EXAM:
RENAL / URINARY TRACT ULTRASOUND COMPLETE

[Series 1: us renal · 54 acquisitions, 14 frames shown]
[im 1/54]
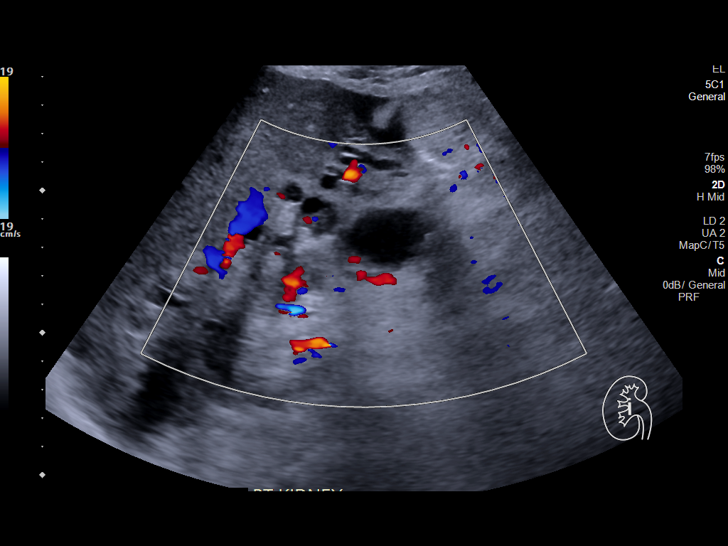
[im 5/54]
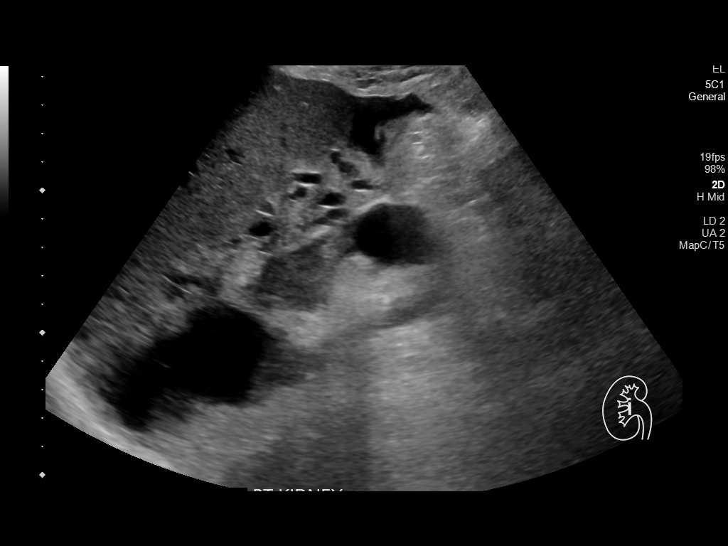
[im 9/54]
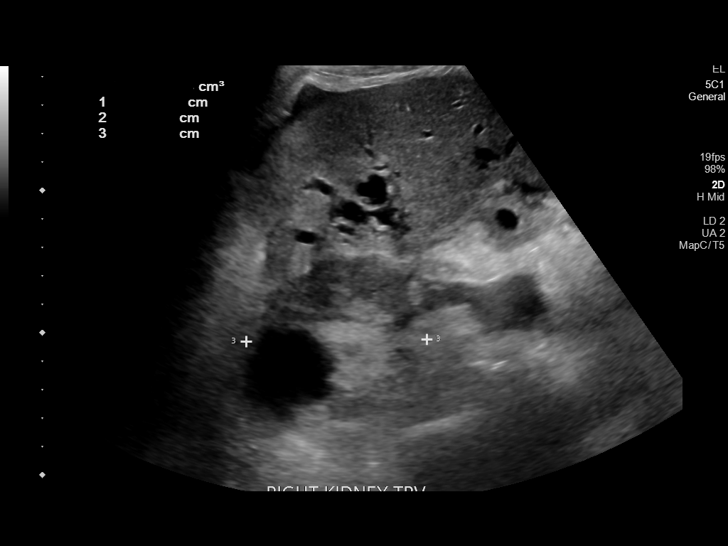
[im 14/54]
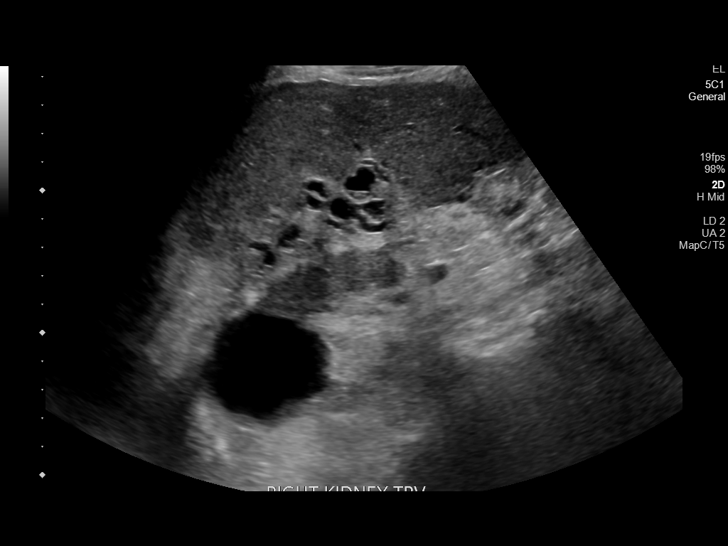
[im 18/54]
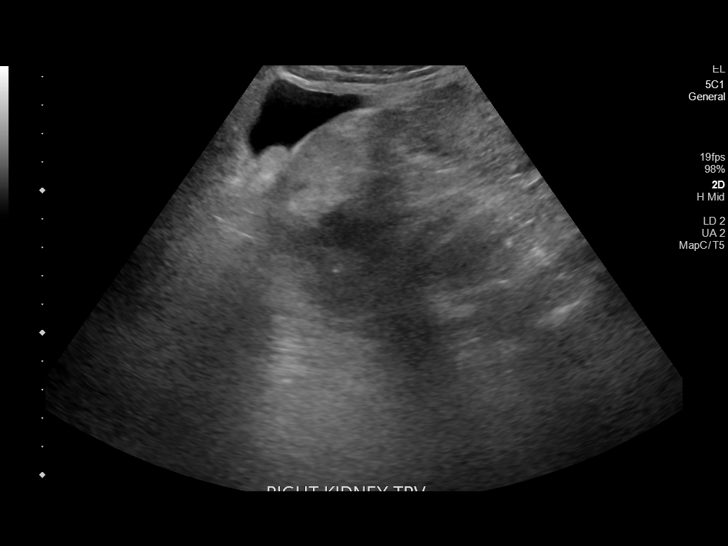
[im 20/54]
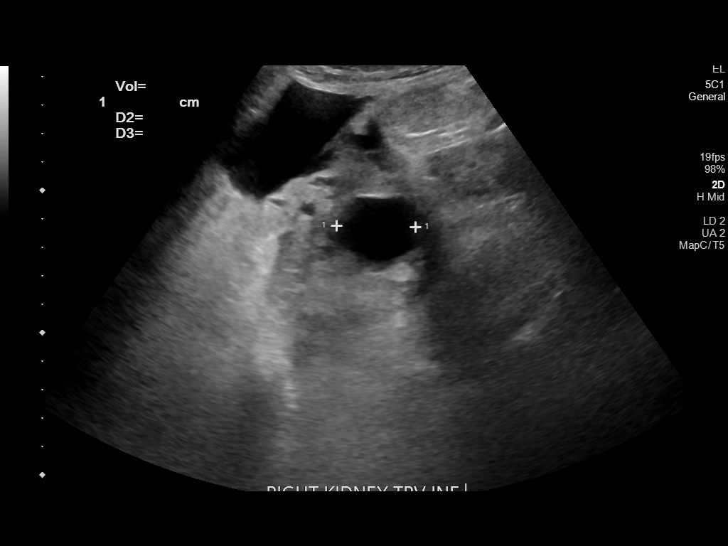
[im 25/54]
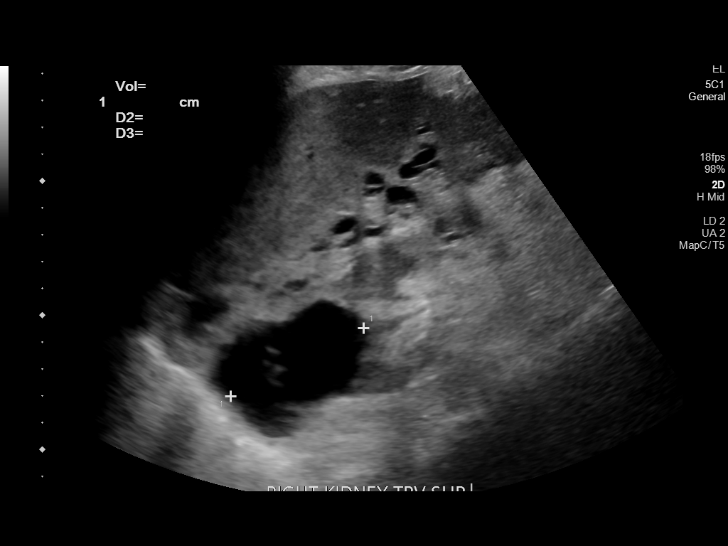
[im 29/54]
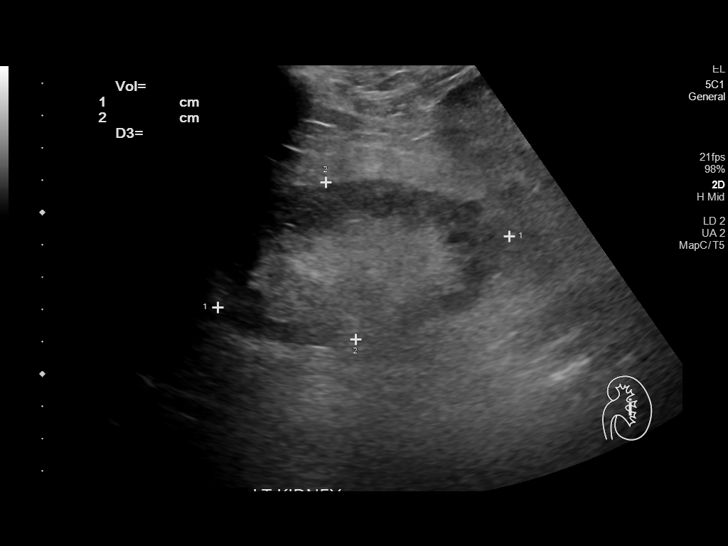
[im 34/54]
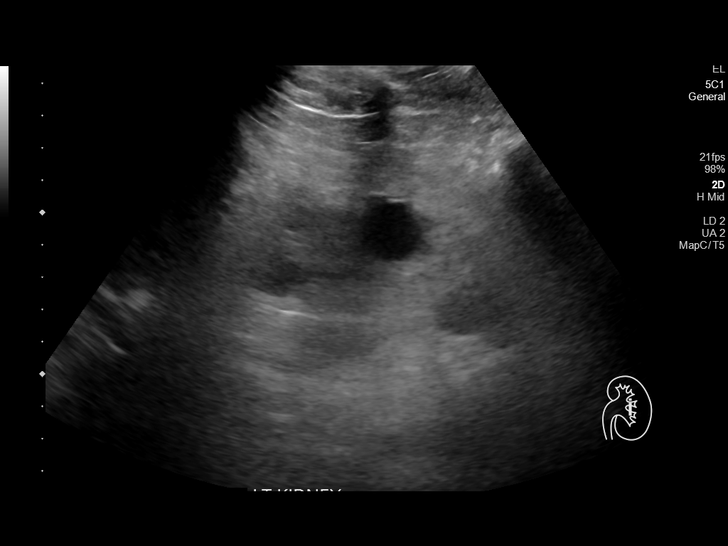
[im 36/54]
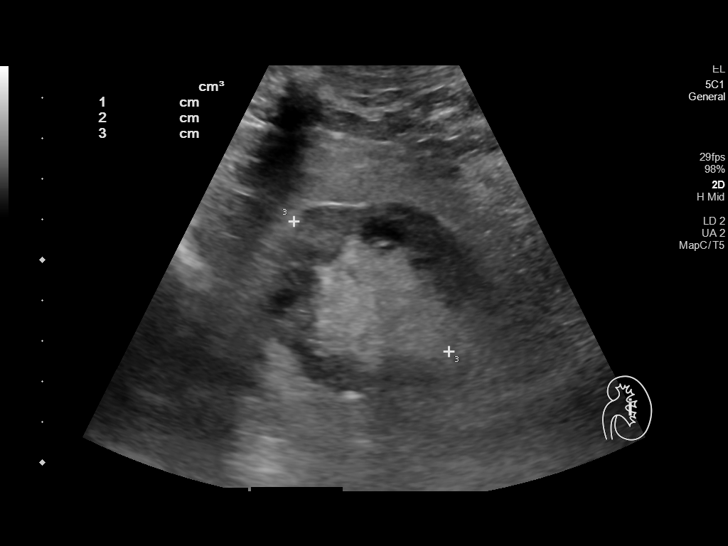
[im 40/54]
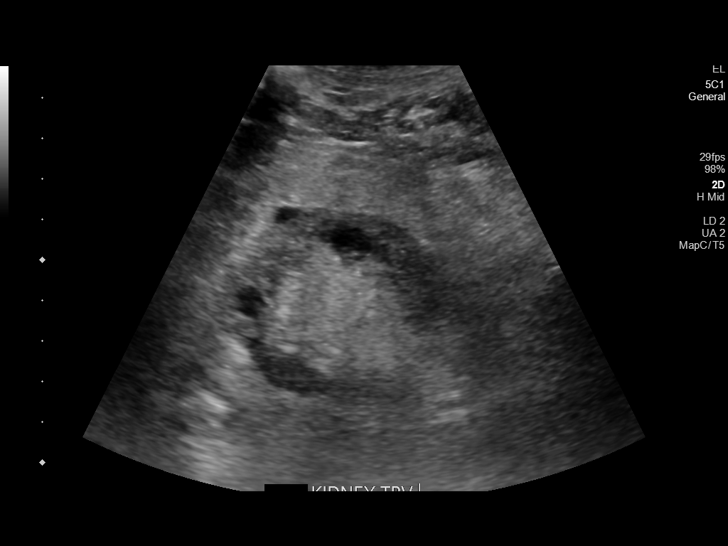
[im 45/54]
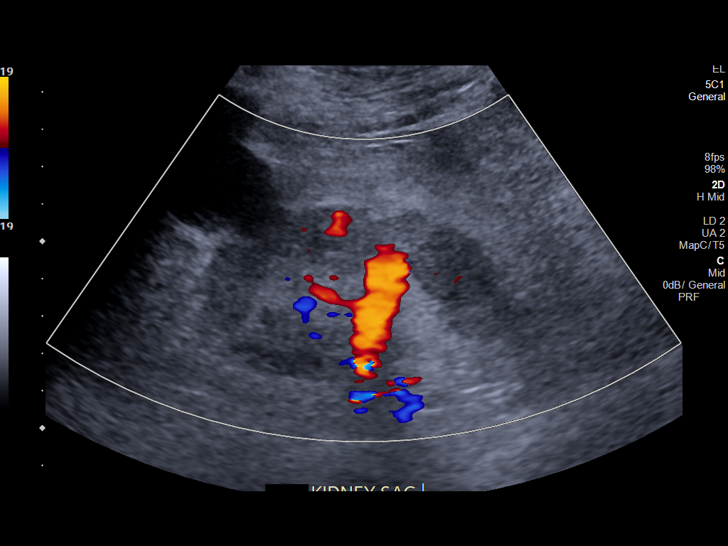
[im 49/54]
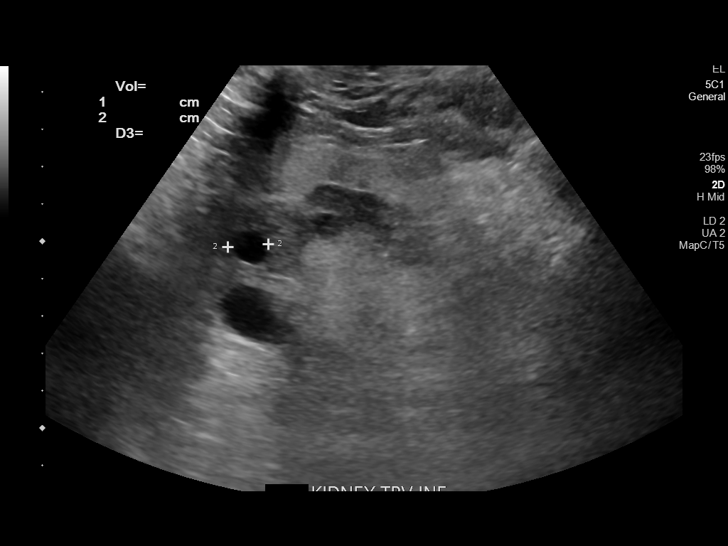
[im 54/54]
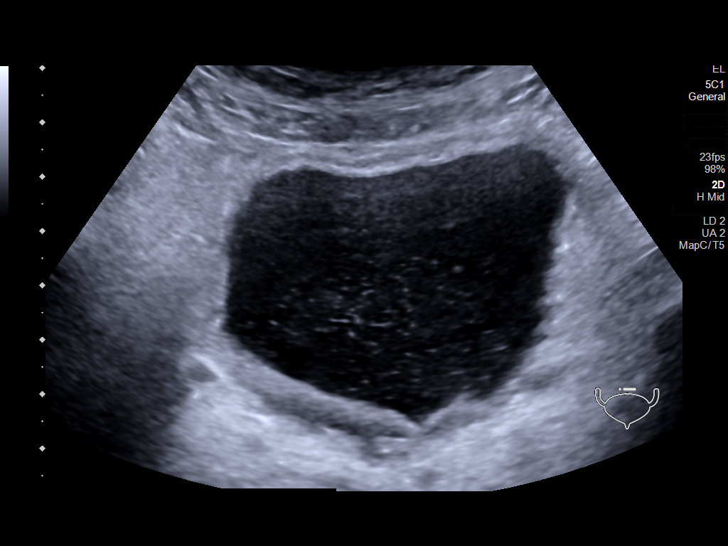

[14 of 25 positions shown; findings below may reference images not displayed]

FINDINGS: Right Kidney:

Renal measurements: 10.5 x 4.6 x 6.4 cm = volume: 161.2 mL. Cortex
is thinned and cortical echogenicity is increased. Two cysts are
identified measuring 6.3 and 3.6 cm in diameter. No solid mass.

Left Kidney:

Renal measurements: 9.3 x 5.0 x 5.0 cm = volume: 120.2 mL. Cortex is
thinned and cortical echogenicity is increased. No solid mass. Two
cysts are identified measuring 2.5 and 1.2 cm in diameter.

Bladder:

Fairly extensive debris is present in the urinary bladder.
IMPRESSION: Negative for hydronephrosis or acute abnormality.

Debris in the urinary bladder.

Senescent change both kidneys.

## 2020-02-02 IMAGING — CT CT HEAD WITHOUT CONTRAST
1 series · 16 of 30 positions shown, 20 images · non-contrast
Comparison: None.

CLINICAL DATA: Syncope

EXAM:
CT HEAD WITHOUT CONTRAST
TECHNIQUE: Contiguous axial images were obtained from the base of the skull
through the vertex without intravenous contrast.

[Series 2: head wo · axial · 0.47mm/px · z∈[+1356,+1491]mm · 16 of 31 slices shown, 20 images]
[im 2/31  brain]
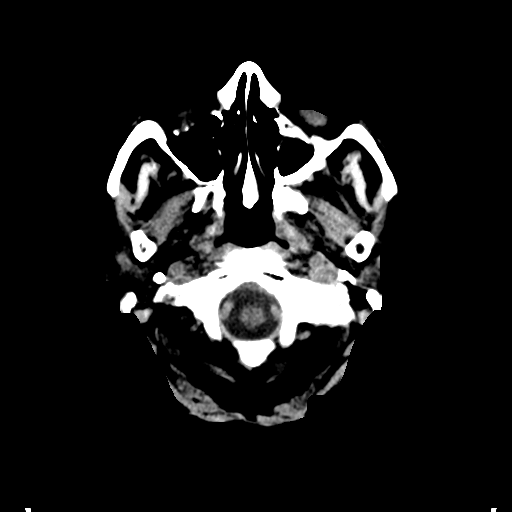
[im 2/31  bone]
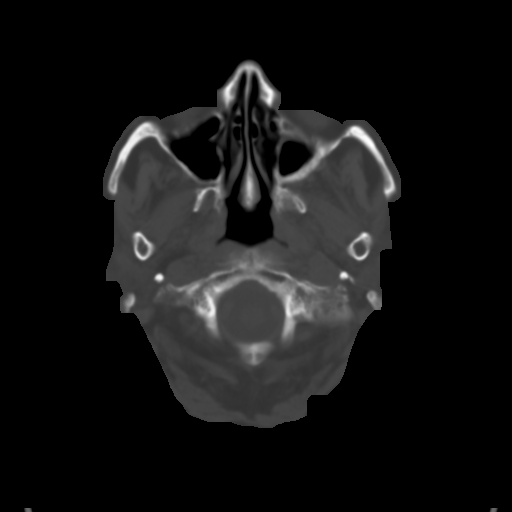
[im 4/31  brain]
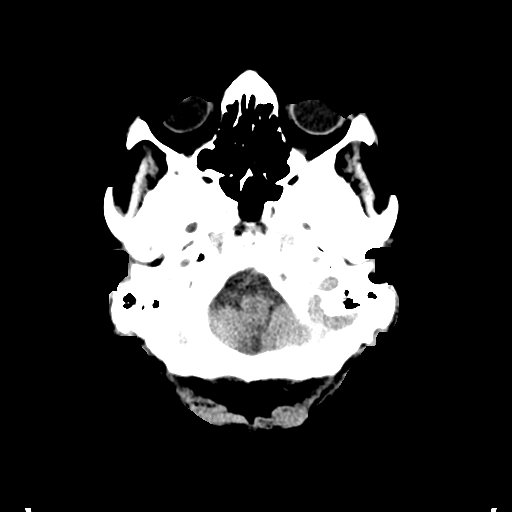
[im 6/31  brain]
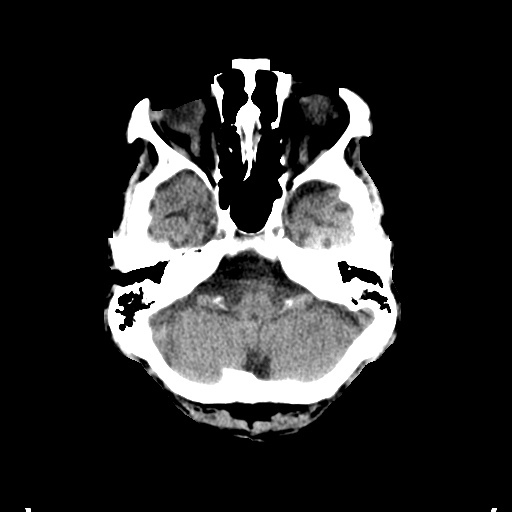
[im 8/31  brain]
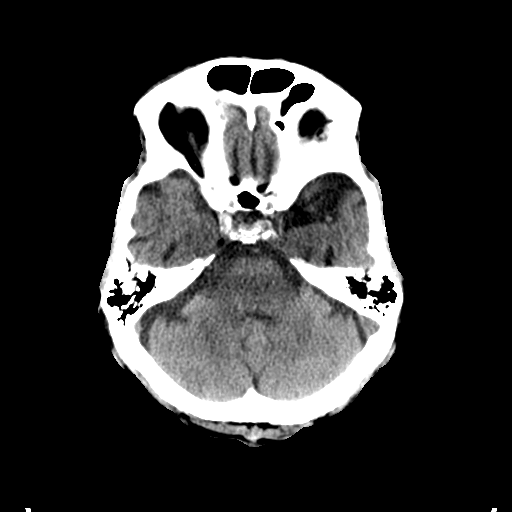
[im 9/31  brain]
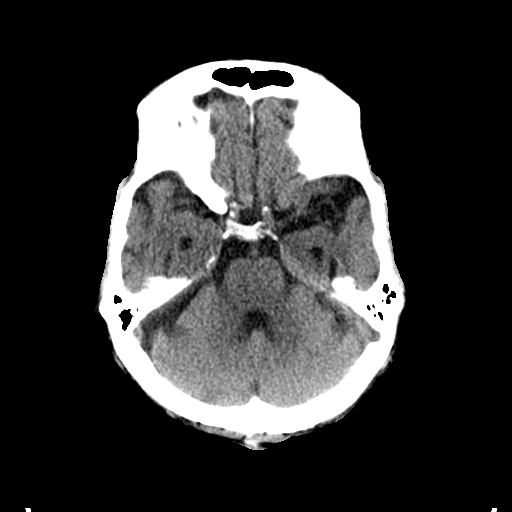
[im 9/31  bone]
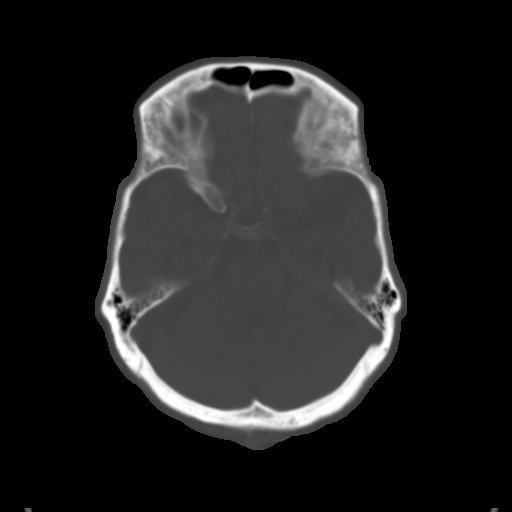
[im 11/31  brain]
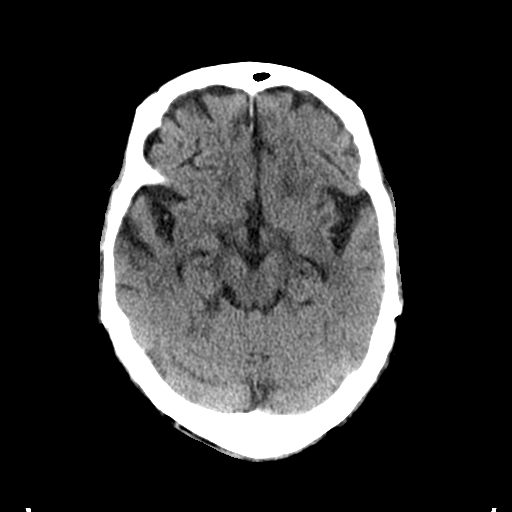
[im 13/31  brain]
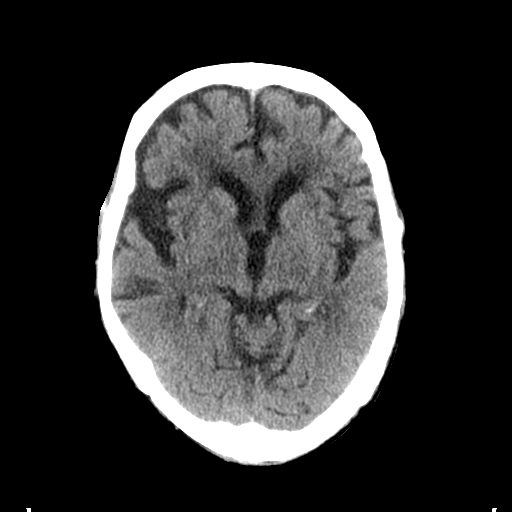
[im 15/31  brain]
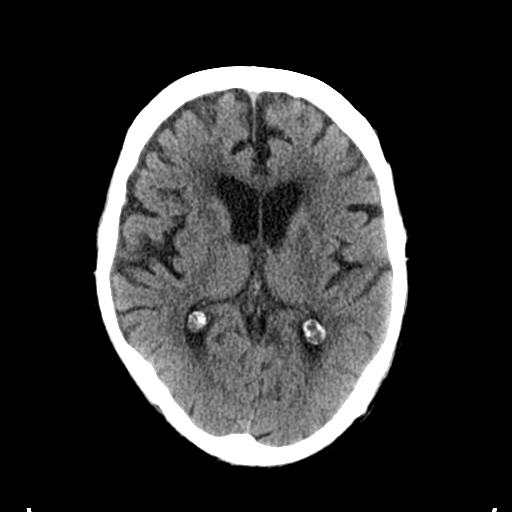
[im 16/31  brain]
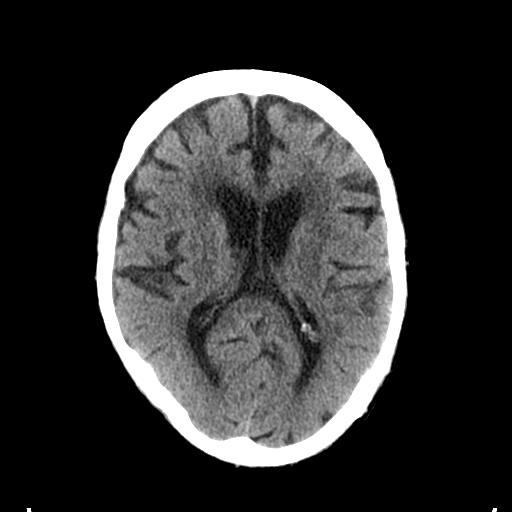
[im 16/31  bone]
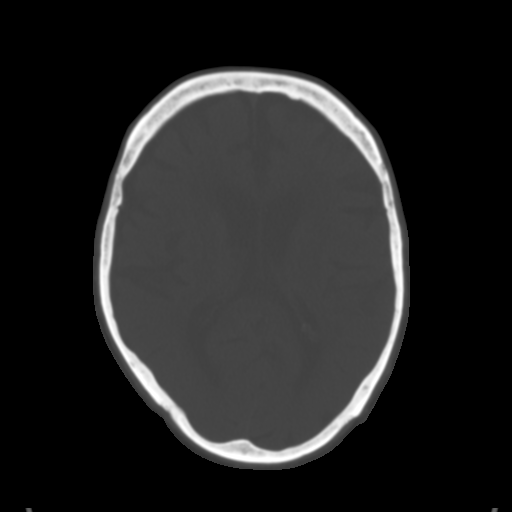
[im 18/31  brain]
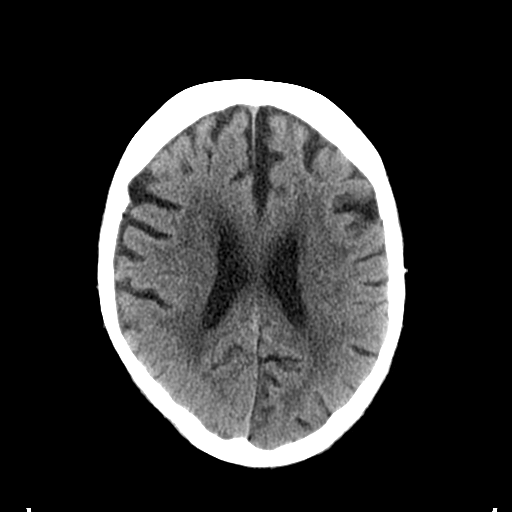
[im 20/31  brain]
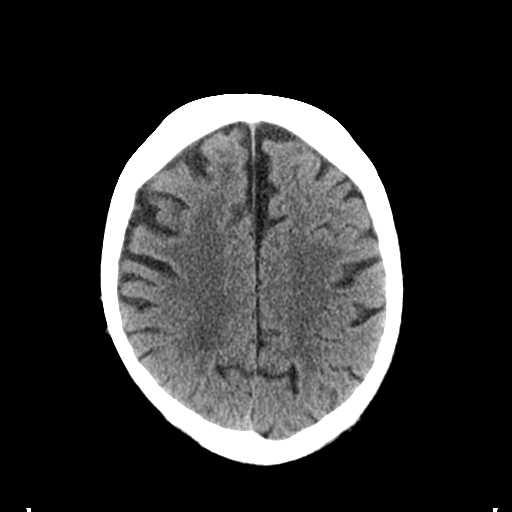
[im 22/31  brain]
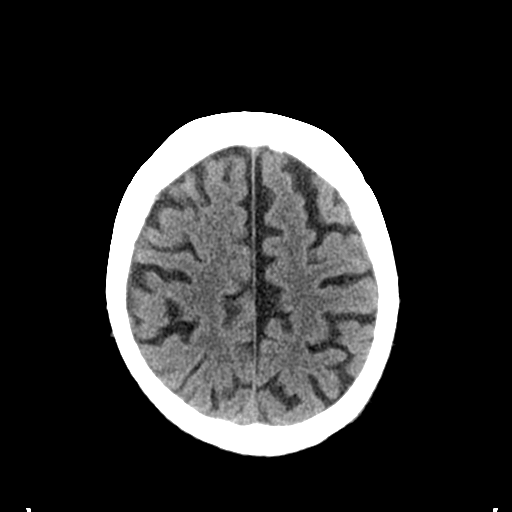
[im 23/31  brain]
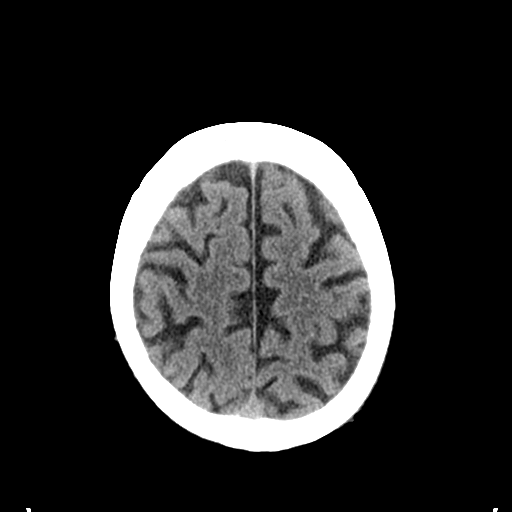
[im 23/31  bone]
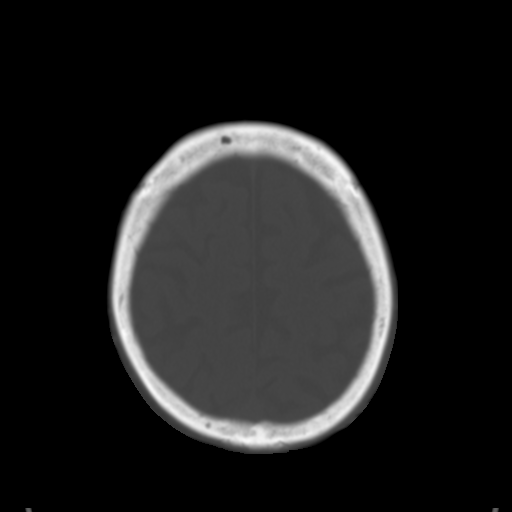
[im 25/31  brain]
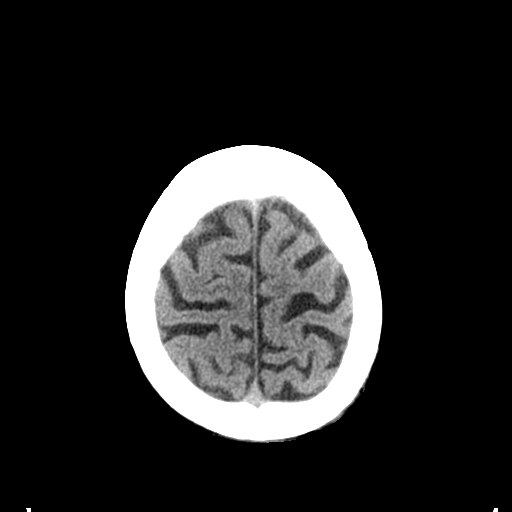
[im 27/31  brain]
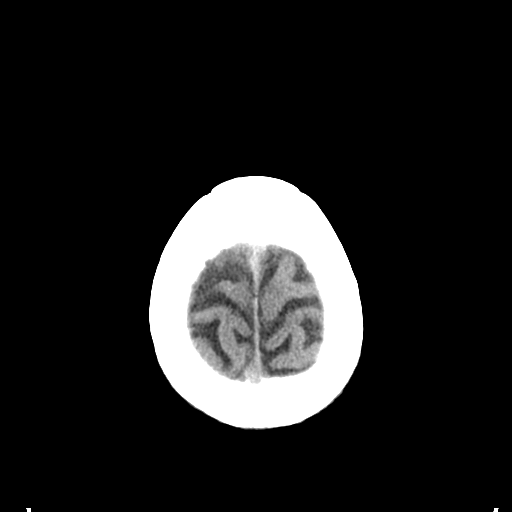
[im 29/31  brain]
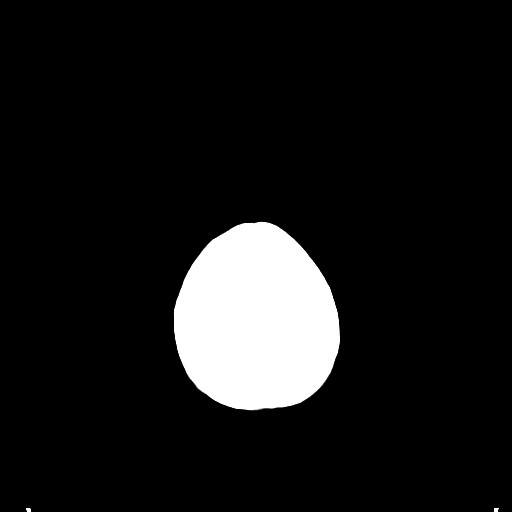

[16 of 30 positions shown; findings below may reference images not displayed]

FINDINGS: Brain: No evidence of acute territorial infarction, hemorrhage,
hydrocephalus,extra-axial collection or mass lesion/mass effect.
There is dilatation the ventricles and sulci consistent with
age-related atrophy. Low-attenuation changes in the deep white
matter consistent with small vessel ischemia.

Vascular: No hyperdense vessel or unexpected calcification.
Calcifications are seen within the internal carotid artery.

Skull: The skull is intact. No fracture or focal lesion identified.

Sinuses/Orbits: A small amount of fluid seen within the left mastoid
air cells. The visualized paranasal sinuses are otherwise well
aerated.

Other: None
IMPRESSION: 1. No acute intracranial abnormality.
2. Findings consistent with age related atrophy and chronic small
vessel ischemia

## 2020-05-22 IMAGING — DX DG CHEST 1V PORT
1 series · 1 of 1 positions shown · non-contrast
Comparison: Chest radiograph 03/28/2008

CLINICAL DATA: COVID exposure and cough. Additional history
provided: Productive cough, fever and neck pain with recent COVID
exposure.

EXAM:
PORTABLE CHEST 1 VIEW

[chest ap]
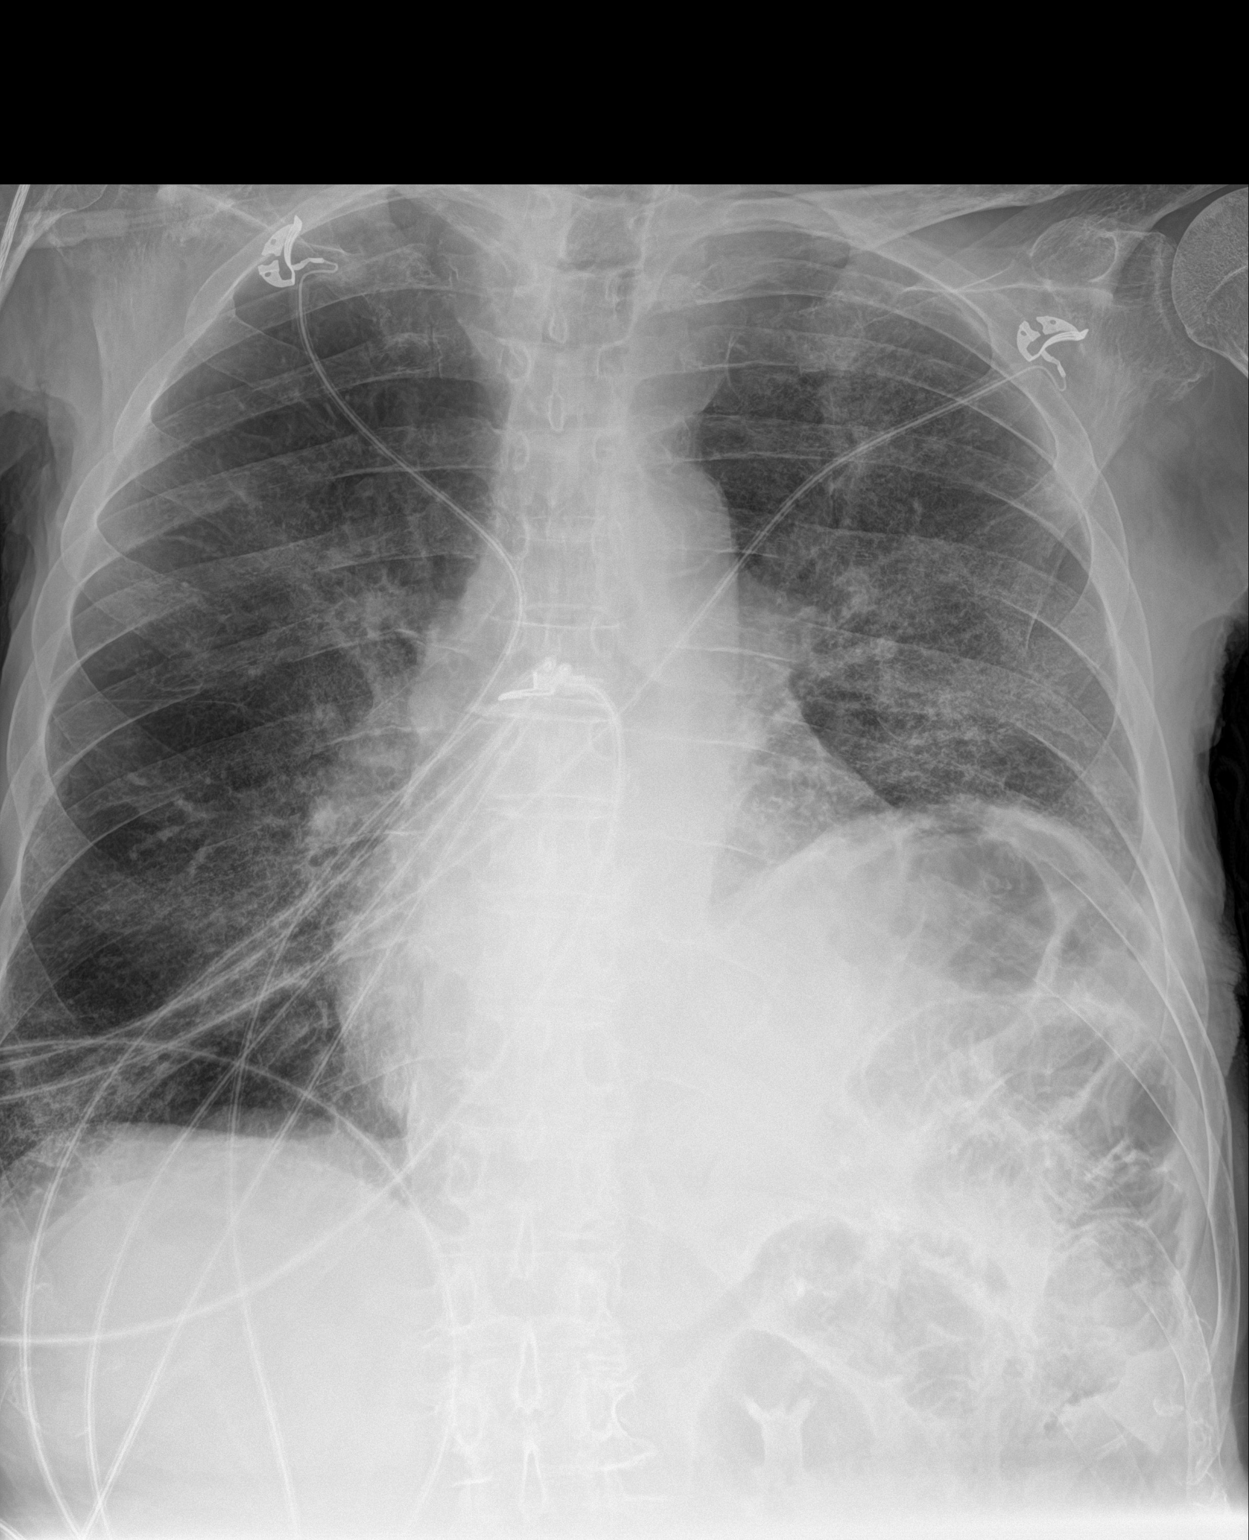

[1 of 1 positions shown; findings below may reference images not displayed]

FINDINGS: Heart size within normal limits.  Aortic atherosclerosis.

Chronic elevation of the left hemidiaphragm.

Patchy ill-defined and interstitial opacities bilaterally,
suspicious for pneumonia given provided history.

No evidence of pneumothorax or sizable pleural effusion.

No acute bony abnormality.

Overlying cardiac monitoring leads.
IMPRESSION: Patchy ill-defined and interstitial opacities within both lungs,
suspicious for pneumonia given provided history. Radiographic
follow-up to resolution recommended.

Aortic atherosclerosis.

Chronic elevation of the left hemidiaphragm.

## 2020-05-23 IMAGING — DX DG CHEST 1V PORT
1 series · 1 of 1 positions shown · non-contrast
Comparison: 01/28/2019 and 03/28/2008

CLINICAL DATA: Hypoxia. COVID pneumonia.

EXAM:
PORTABLE CHEST 1 VIEW

[chest]
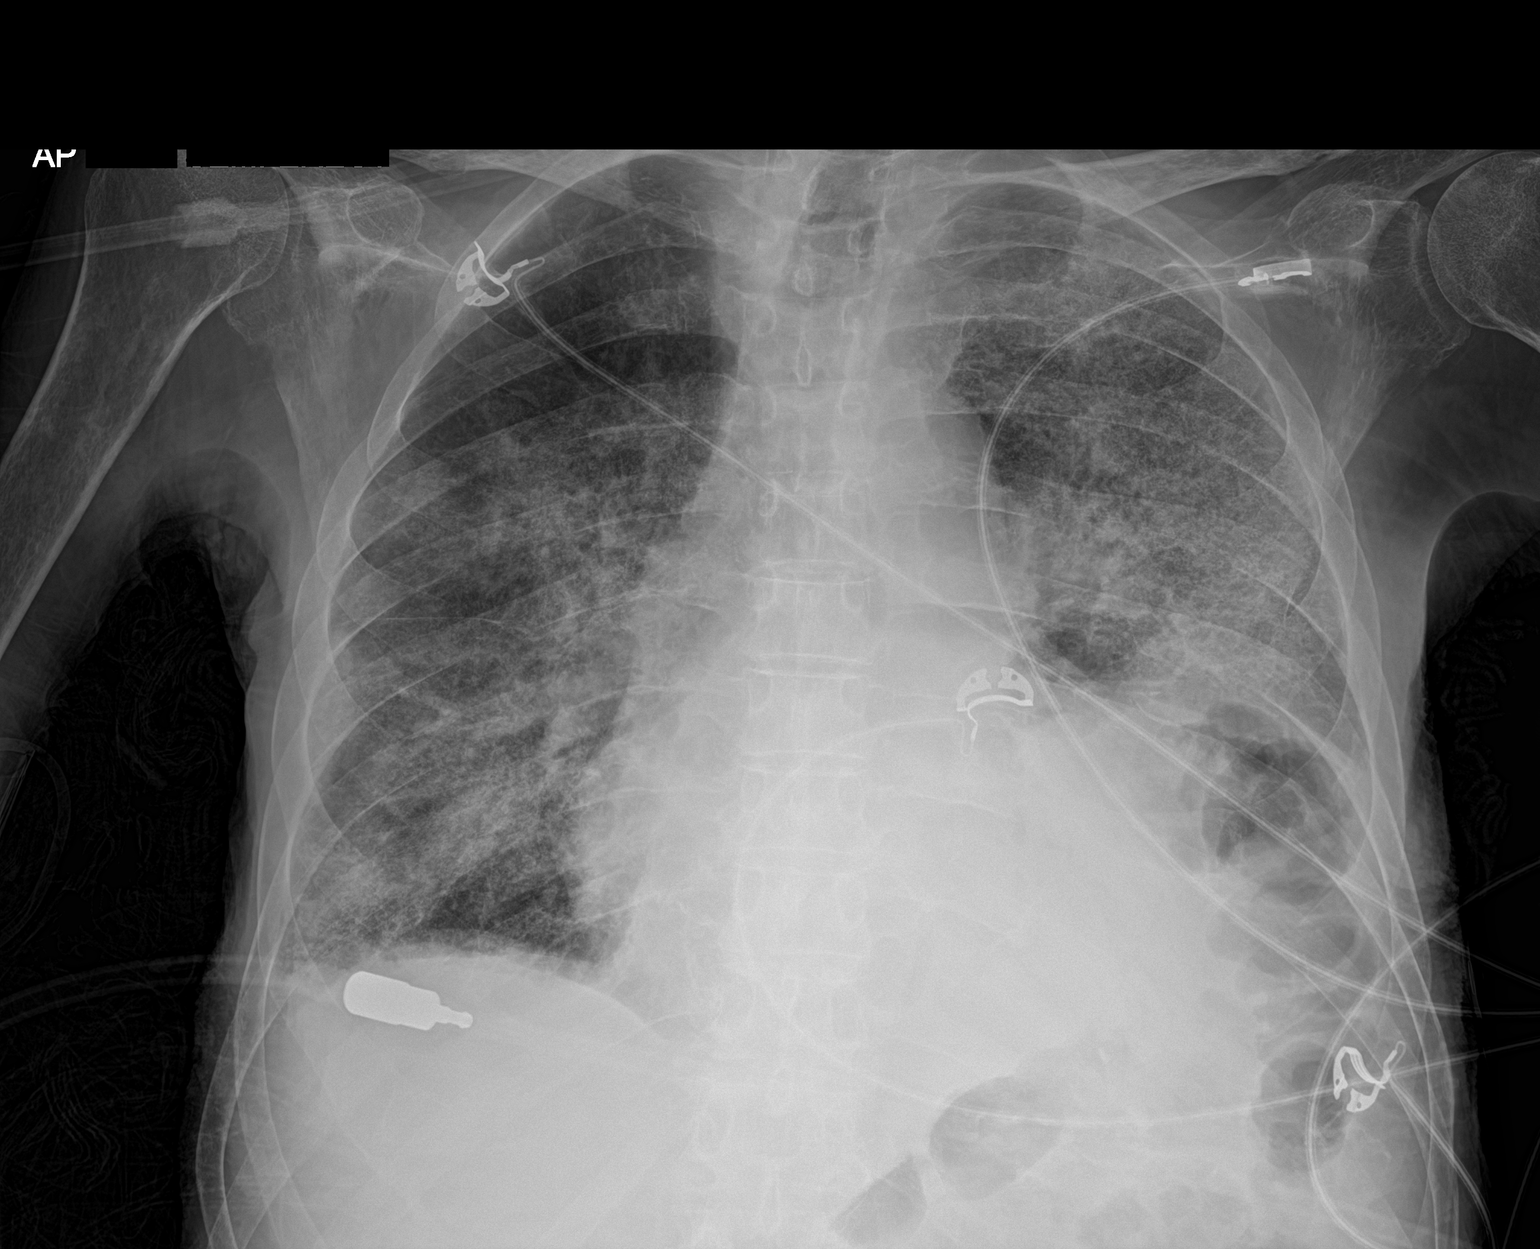

[1 of 1 positions shown; findings below may reference images not displayed]

FINDINGS: There has been rapid progression of extensive bilateral hazy
pulmonary infiltrates.

Heart size and vascularity are normal. Chronic elevation of the left
hemidiaphragm. No significant bone abnormality.
IMPRESSION: 1. Rapid progression of extensive bilateral hazy pulmonary
infiltrates consistent with bilateral pneumonia. No pleural effusion
or pneumothorax.
2. Chronic elevation of the left hemidiaphragm.
